# Patient Record
Sex: Female | Born: 1990 | Race: White | Hispanic: No | Marital: Married | Smoking: Former smoker
Health system: Southern US, Community
[De-identification: ages and names within clinical notes are randomized; demographics above are authoritative.]

## PROBLEM LIST (undated history)

## (undated) DIAGNOSIS — K219 Gastro-esophageal reflux disease without esophagitis: Secondary | ICD-10-CM

## (undated) DIAGNOSIS — Z8742 Personal history of other diseases of the female genital tract: Secondary | ICD-10-CM

## (undated) DIAGNOSIS — I10 Essential (primary) hypertension: Secondary | ICD-10-CM

## (undated) DIAGNOSIS — F329 Major depressive disorder, single episode, unspecified: Secondary | ICD-10-CM

## (undated) DIAGNOSIS — F32A Depression, unspecified: Secondary | ICD-10-CM

## (undated) DIAGNOSIS — G43909 Migraine, unspecified, not intractable, without status migrainosus: Secondary | ICD-10-CM

## (undated) DIAGNOSIS — F419 Anxiety disorder, unspecified: Secondary | ICD-10-CM

## (undated) HISTORY — DX: Migraine, unspecified, not intractable, without status migrainosus: G43.909

## (undated) HISTORY — PX: CHOLECYSTECTOMY: SHX55

## (undated) HISTORY — DX: Personal history of other diseases of the female genital tract: Z87.42

---
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
## 2010-09-24 ENCOUNTER — Emergency Department: Payer: Self-pay | Admitting: Emergency Medicine

## 2011-08-25 ENCOUNTER — Ambulatory Visit: Payer: Self-pay | Admitting: Internal Medicine

## 2015-08-10 ENCOUNTER — Ambulatory Visit
Admission: EM | Admit: 2015-08-10 | Discharge: 2015-08-10 | Disposition: A | Discharge status: Home / Self Care | Payer: Managed Care, Other (non HMO) | Attending: Family Medicine | Admitting: Family Medicine

## 2015-08-10 ENCOUNTER — Encounter: Payer: Self-pay | Admitting: Gynecology

## 2015-08-10 DIAGNOSIS — J101 Influenza due to other identified influenza virus with other respiratory manifestations: Secondary | ICD-10-CM | POA: Diagnosis not present

## 2015-08-10 HISTORY — DX: Major depressive disorder, single episode, unspecified: F32.9

## 2015-08-10 HISTORY — DX: Depression, unspecified: F32.A

## 2015-08-10 LAB — RAPID STREP SCREEN (MED CTR MEBANE ONLY): Streptococcus, Group A Screen (Direct): NEGATIVE

## 2015-08-10 LAB — RAPID INFLUENZA A&B ANTIGENS
Influenza A (ARMC): DETECTED
Influenza B (ARMC): NOT DETECTED

## 2015-08-10 MED ORDER — OSELTAMIVIR PHOSPHATE 75 MG PO CAPS: 75.0000 mg | ORAL_CAPSULE | Freq: Two times a day (BID) | ORAL | Status: DC

## 2015-08-10 NOTE — ED Provider Notes (Signed)
CSN: [id number]     Arrival date & time 08/10/15  1039 History   First MD Initiated Contact with Patient 08/10/15 1101     Chief Complaint  Patient presents with  . Sore Throat  . Chills   (Consider location/radiation/quality/duration/timing/severity/associated sxs/prior Treatment) HPI: Patient presents today with symptoms of sore throat, chills, myalgias, fever. Patient states that she has minimal cough and nasal congestion. She denies any nausea vomiting or diarrhea. She denies any abdominal pain chest pain or shortness of breath. She has not had the flu vaccination. She works at [employer].  Past Medical History  Diagnosis Date  . Depression    Past Surgical History  Procedure Laterality Date  . Cholecystectomy     No family history on file. Social History  Substance Use Topics  . Smoking status: Current Some Day Smoker -- 1.00 packs/day  . Smokeless tobacco: None  . Alcohol Use: Yes   OB History    No data available     Review of Systems: Negative except mentioned above.   Allergies  Review of patient's allergies indicates no known allergies.  Home Medications   Prior to Admission medications   Medication Sig Start Date End Date Taking? Authorizing Provider  sertraline (ZOLOFT) 25 MG tablet Take 25 mg by mouth daily.   Yes Historical Provider, MD  oseltamivir (TAMIFLU) 75 MG capsule Take 1 capsule (75 mg total) by mouth every 12 (twelve) hours. 08/10/15   Jolene Provost, MD   Meds Ordered and Administered this Visit  Medications - No data to display  BP 130/92 mmHg  Pulse 104  Temp(Src) 100.7 F (38.2 C) (Oral)  Resp 18  Ht  (1.6 m)  Wt 170 lb (77.111 kg)  BMI 30.12 kg/m2  SpO2 100%  LMP 07/20/2015 No data found.   Physical Exam   GENERAL: NAD HEENT: mild ro moderate pharyngeal erythema, no exudate, no erythema of TMs, no cervical LAD RESP: CTA B CARD: RRR NEURO: CN II-XII grossly intact   ED Course  Procedures (including critical care  time)  Labs Review Labs Reviewed  RAPID INFLUENZA A&B ANTIGENS ([facility] ONLY)  RAPID STREP SCREEN (NOT AT [facility])  CULTURE, GROUP A STREP [facility])    MDM   1. Influenza A    flu screen positive for influenza A. Rapid strep test negative. Patient given prescription for Tamiflu. Encouraged Tylenol, Motrin when necessary. Rest, hydration encouraged if symptoms persist or worsen I do recommend the patient seek medical attention. Work excuse given for 2 days. Follow-up here or with PMD if further time needed.    Jolene Provost, MD 08/10/15 1158

## 2015-08-10 NOTE — ED Notes (Signed)
Patient c/o sore throat / chills / body ache / temp last pm of 103.

## 2015-08-12 LAB — CULTURE, GROUP A STREP (THRC)

## 2016-01-02 ENCOUNTER — Ambulatory Visit (INDEPENDENT_AMBULATORY_CARE_PROVIDER_SITE_OTHER): Payer: Worker's Compensation

## 2016-01-02 ENCOUNTER — Ambulatory Visit
Admission: EM | Admit: 2016-01-02 | Discharge: 2016-01-02 | Disposition: A | Discharge status: Home / Self Care | Payer: Worker's Compensation | Attending: Family Medicine | Admitting: Family Medicine

## 2016-01-02 ENCOUNTER — Encounter: Payer: Self-pay | Admitting: Emergency Medicine

## 2016-01-02 DIAGNOSIS — S39012A Strain of muscle, fascia and tendon of lower back, initial encounter: Secondary | ICD-10-CM

## 2016-01-02 LAB — PREGNANCY, URINE: Preg Test, Ur: NEGATIVE

## 2016-01-02 MED ORDER — KETOROLAC TROMETHAMINE 60 MG/2ML IM SOLN
60.0000 mg | Freq: Once | INTRAMUSCULAR | Status: AC
  Administered 2016-01-02: 60 mg via INTRAMUSCULAR

## 2016-01-02 MED ORDER — MELOXICAM 15 MG PO TABS: 15.0000 mg | ORAL_TABLET | Freq: Every day | ORAL | Status: DC

## 2016-01-02 MED ORDER — TIZANIDINE HCL 4 MG PO TABS: 4.0000 mg | ORAL_TABLET | Freq: Four times a day (QID) | ORAL | Status: DC | PRN

## 2016-01-02 NOTE — ED Provider Notes (Signed)
CSN: [id number]     Arrival date & time 01/02/16  1126 History   First MD Initiated Contact with Patient 01/02/16 1320     Chief Complaint  Patient presents with  . Back Pain  . Worker's Comp    (Consider location/radiation/quality/duration/timing/severity/associated sxs/prior Treatment) HPI  25 year old female who presents with a work comp back injury. She states that she works at [employer] and was helping and older man lift to 6070 pound box when he lost his grip in effort to keep the box from falling she bent forward and twisted onto a pallet. At that time she felt a pop in her lower back. She did not have pain initially but later on that evening began to develop low back pain that radiated over her anterior thighs to the level of her knees. She has no numbness or tingling. She states that the pain into her legs is constant and bilateral.. She's had no previous back injuries.    Past Medical History  Diagnosis Date  . Depression    Past Surgical History  Procedure Laterality Date  . Cholecystectomy     History reviewed. No pertinent family history. Social History  Substance Use Topics  . Smoking status: Current Some Day Smoker -- 1.00 packs/day    Types: Cigarettes  . Smokeless tobacco: None  . Alcohol Use: Yes   OB History    No data available     Review of Systems  Constitutional: Positive for activity change. Negative for fever, chills and fatigue.  Musculoskeletal: Positive for back pain.  All other systems reviewed and are negative.   Allergies  Review of patient's allergies indicates no known allergies.  Home Medications   Prior to Admission medications   Medication Sig Start Date End Date Taking? Authorizing Provider  meloxicam (MOBIC) 15 MG tablet Take 1 tablet (15 mg total) by mouth daily. 01/02/16   Lutricia FeilWilliam P Aneth Schlagel, PA-C  oseltamivir (TAMIFLU) 75 MG capsule Take 1 capsule (75 mg total) by mouth every 12 (twelve) hours. 08/10/15   Jolene ProvostKirtida Patel, MD   sertraline (ZOLOFT) 25 MG tablet Take 25 mg by mouth daily.    Historical Provider, MD  tiZANidine (ZANAFLEX) 4 MG tablet Take 1 tablet (4 mg total) by mouth every 6 (six) hours as needed for muscle spasms. 01/02/16   Lutricia FeilWilliam P Ysela Hettinger, PA-C   Meds Ordered and Administered this Visit   Medications  ketorolac (TORADOL) injection 60 mg (60 mg Intramuscular Given 01/02/16 1341)    BP 132/87 mmHg  Pulse 85  Temp(Src) 98 F (36.7 C) (Oral)  Resp 16  Ht 5\' 3"  (1.6 m)  Wt 175 lb (79.379 kg)  BMI 31.01 kg/m2  SpO2 100%  LMP 12/26/2015 (Approximate) No data found.   Physical Exam  Constitutional: She is oriented to person, place, and time. She appears well-developed and well-nourished. No distress.  HENT:  Head: Normocephalic and atraumatic.  Eyes: Conjunctivae are normal. Pupils are equal, round, and reactive to light.  Neck: Normal range of motion. Neck supple.  Musculoskeletal: She exhibits tenderness.  Omission of the lumbar spine was carried out with Efraim KaufmannMelissa, C MA chaperone. Eustachian has a level pelvis in stance. She has very limited range of motion to forward flexion and lateral flexion bilaterally complaining of pain with all motion. She is able to toe and heel walk adequately. DTRs are 2+ over 4 at the knee and ankle. There is no clonus present. Sensation is intact to light touch throughout the lower extremities. EHL  peroneal and anterior tibialis muscles are strong to clinical testing. Straight leg raise testing is negative at 90 in the sitting position bilaterally.  Neurological: She is alert and oriented to person, place, and time. She has normal reflexes. She displays normal reflexes. She exhibits normal muscle tone. Coordination normal.  Skin: Skin is warm and dry. She is not diaphoretic.  Psychiatric: She has a normal mood and affect. Her behavior is normal. Judgment and thought content normal.  Nursing note and vitals reviewed.   ED Course  Procedures (including critical  care time)  Labs Review Labs Reviewed  PREGNANCY, URINE    Imaging Review Dg Lumbar Spine Complete  01/02/2016  CLINICAL DATA:  Low back pain following heavy lifting, initial encounter EXAM: LUMBAR SPINE - COMPLETE 4+ VIEW COMPARISON:  None. FINDINGS: There is no evidence of lumbar spine fracture. Alignment is normal. Intervertebral disc spaces are maintained. IMPRESSION: No acute abnormality noted. Electronically Signed   By: Alcide CleverMark  Lukens M.D.   On: 01/02/2016 14:28     Visual Acuity Review  Right Eye Distance:   Left Eye Distance:   Bilateral Distance:    Right Eye Near:   Left Eye Near:    Bilateral Near:      Medications  ketorolac (TORADOL) injection 60 mg (60 mg Intramuscular Given 01/02/16 1341)     MDM   1. Lumbar strain, initial encounter    Discharge Medication List as of 01/02/2016  2:44 PM    START taking these medications   Details  meloxicam (MOBIC) 15 MG tablet Take 1 tablet (15 mg total) by mouth daily., Starting 01/02/2016, Until Discontinued, Normal    tiZANidine (ZANAFLEX) 4 MG tablet Take 1 tablet (4 mg total) by mouth every 6 (six) hours as needed for muscle spasms., Starting 01/02/2016, Until Discontinued, Normal      Plan: 1. Test/x-ray results and diagnosis reviewed with patient 2. rx as per orders; risks, benefits, potential side effects reviewed with patient 3. Recommend supportive treatment with Symptom avoidance and rest. Use heat or ice as necessary. She needs to follow [facility] with Glenard Haringommy Moore ,FNP [facility] 4. F/u prn if symptoms worsen or don't improve     Lutricia FeilWilliam P Fontaine Hehl, PA-C 01/02/16 1455

## 2016-01-02 NOTE — Discharge Instructions (Signed)
Back Injury Prevention °Back injuries can be very painful. They can also be difficult to heal. After having one back injury, you are more likely to injure your back again. It is important to learn how to avoid injuring or re-injuring your back. The following tips can help you to prevent a back injury. °WHAT SHOULD I KNOW ABOUT PHYSICAL FITNESS? °· Exercise for 30 minutes per day on most days of the week or as directed by your health care provider. Make sure to: °· Do aerobic exercises, such as walking, jogging, biking, or swimming. °· Do exercises that increase balance and strength, such as tai chi and yoga. These can decrease your risk of falling and injuring your back. °· Do stretching exercises to help with flexibility. °· Try to develop strong abdominal muscles. Your abdominal muscles provide a lot of the support that is needed by your back. °· Maintain a healthy weight.  This helps to decrease your risk of a back injury. °WHAT SHOULD I KNOW ABOUT MY DIET? °· Talk with your health care provider about your overall diet. Take supplements and vitamins only as directed by your health care provider. °· Talk with your health care provider about how much calcium and vitamin D you need each day. These nutrients help to prevent weakening of the bones (osteoporosis). Osteoporosis can cause broken (fractured) bones, which lead to back pain. °· Include good sources of calcium in your diet, such as dairy products, green leafy vegetables, and products that have had calcium added to them (fortified). °· Include good sources of vitamin D in your diet, such as milk and foods that are fortified with vitamin D. °WHAT SHOULD I KNOW ABOUT MY POSTURE? °· Sit up straight and stand up straight. Avoid leaning forward when you sit or hunching over when you stand. °· Choose chairs that have good low-back (lumbar) support. °· If you work at a desk, sit close to it so you do not need to lean over. Keep your chin tucked in. Keep your neck  drawn back, and keep your elbows bent at a right angle. Your arms should look like the letter "L." °· Sit high and close to the steering wheel when you drive. Add a lumbar support to your car seat, if needed. °· Avoid sitting or standing in one position for very long. Take breaks to get up, stretch, and walk around at least one time every hour. Take breaks every hour if you are driving for long periods of time. °· Sleep on your side with your knees slightly bent, or sleep on your back with a pillow under your knees. Do not lie on the front of your body to sleep. °WHAT SHOULD I KNOW ABOUT LIFTING, TWISTING, AND REACHING? °Lifting and Heavy Lifting °· Avoid heavy lifting, especially repetitive heavy lifting. If you must do heavy lifting: °· Stretch before lifting. °· Work slowly. °· Rest between lifts. °· Use a tool such as a cart or a dolly to move objects if one is available. °· Make several small trips instead of carrying one heavy load. °· Ask for help when you need it, especially when moving big objects. °· Follow these steps when lifting: °· Stand with your feet shoulder-width apart. °· Get as close to the object as you can. Do not try to pick up a heavy object that is far from your body. °· Use handles or lifting straps if they are available. °· Bend at your knees. Squat down, but keep your heels off the floor. °·   Keep your shoulders pulled back, your chin tucked in, and your back straight. °· Lift the object slowly while you tighten the muscles in your legs, abdomen, and buttocks. Keep the object as close to the center of your body as possible. °· Follow these steps when putting down a heavy load: °· Stand with your feet shoulder-width apart. °· Lower the object slowly while you tighten the muscles in your legs, abdomen, and buttocks. Keep the object as close to the center of your body as possible. °· Keep your shoulders pulled back, your chin tucked in, and your back straight. °· Bend at your knees. Squat  down, but keep your heels off the floor. °· Use handles or lifting straps if they are available. °Twisting and Reaching °· Avoid lifting heavy objects above your waist. °· Do not twist at your waist while you are lifting or carrying a load. If you need to turn, move your feet. °· Do not bend over without bending at your knees. °· Avoid reaching over your head, across a table, or for an object on a high surface. °WHAT ARE SOME OTHER TIPS? °· Avoid wet floors and icy ground. Keep sidewalks clear of ice to prevent falls. °· Do not sleep on a mattress that is too soft or too hard. °· Keep items that are used frequently within easy reach. °· Put heavier objects on shelves at waist level, and put lighter objects on lower or higher shelves. °· Find ways to decrease your stress, such as exercise, massage, or relaxation techniques. Stress can build up in your muscles. Tense muscles are more vulnerable to injury. °· Talk with your health care provider if you feel anxious or depressed. These conditions can make back pain worse. °· Wear flat heel shoes with cushioned soles. °· Avoid sudden movements. °· Use both shoulder straps when carrying a backpack. °· Do not use any tobacco products, including cigarettes, chewing tobacco, or electronic cigarettes. If you need help quitting, ask your health care provider. °  °This information is not intended to replace advice given to you by your health care provider. Make sure you discuss any questions you have with your health care provider. °  °Document Released: 07/30/2004 Document Revised: 11/06/2014 Document Reviewed: 06/26/2014 °Elsevier Interactive Patient Education ©2016 [employer] ° °Lumbosacral Strain °Lumbosacral strain is a strain of any of the parts that make up your lumbosacral vertebrae. Your lumbosacral vertebrae are the bones that make up the lower third of your backbone. Your lumbosacral vertebrae are held together by muscles and tough, fibrous tissue (ligaments).    °CAUSES  °A sudden blow to your back can cause lumbosacral strain. Also, anything that causes an excessive stretch of the muscles in the low back can cause this strain. This is typically seen when people exert themselves strenuously, fall, lift heavy objects, bend, or crouch repeatedly. °RISK FACTORS °· Physically demanding work. °· Participation in pushing or pulling sports or sports that require a sudden twist of the back (tennis, golf, baseball). °· Weight lifting. °· Excessive lower back curvature. °· Forward-tilted pelvis. °· Weak back or abdominal muscles or both. °· Tight hamstrings. °SIGNS AND SYMPTOMS  °Lumbosacral strain may cause pain in the area of your injury or pain that moves (radiates) down your leg.  °DIAGNOSIS °Your health care provider can often diagnose lumbosacral strain through a physical exam. In some cases, you may need tests such as X-ray exams.  °TREATMENT  °Treatment for your lower back injury depends on many factors that   your clinician will have to evaluate. However, most treatment will include the use of anti-inflammatory medicines. °HOME CARE INSTRUCTIONS  °· Avoid hard physical activities (tennis, racquetball, waterskiing) if you are not in proper physical condition for it. This may aggravate or create problems. °· If you have a back problem, avoid sports requiring sudden body movements. Swimming and walking are generally safer activities. °· Maintain good posture. °· Maintain a healthy weight. °· For acute conditions, you may put ice on the injured area. °¨ Put ice in a plastic bag. °¨ Place a towel between your skin and the bag. °¨ Leave the ice on for 20 minutes, 2-3 times a day. °· When the low back starts healing, stretching and strengthening exercises may be recommended. °SEEK MEDICAL CARE IF: °· Your back pain is getting worse. °· You experience severe back pain not relieved with medicines. °SEEK IMMEDIATE MEDICAL CARE IF:  °· You have numbness, tingling, weakness, or problems  with the use of your arms or legs. °· There is a change in bowel or bladder control. °· You have increasing pain in any area of the body, including your belly (abdomen). °· You notice shortness of breath, dizziness, or feel faint. °· You feel sick to your stomach (nauseous), are throwing up (vomiting), or become sweaty. °· You notice discoloration of your toes or legs, or your feet get very cold. °MAKE SURE YOU:  °· Understand these instructions. °· Will watch your condition. °· Will get help right away if you are not doing well or get worse. °  °This information is not intended to replace advice given to you by your health care provider. Make sure you discuss any questions you have with your health care provider. °  °Document Released: 04/01/2005 Document Revised: 07/13/2014 Document Reviewed: 02/08/2013 °Elsevier Interactive Patient Education ©2016 [employer] ° °

## 2016-01-02 NOTE — ED Notes (Signed)
Patient states that she was lifting a box weighing about 60lbs at work yesterday and felt a pop in her lower back.  Patient c/o back pain.

## 2016-07-24 ENCOUNTER — Ambulatory Visit (INDEPENDENT_AMBULATORY_CARE_PROVIDER_SITE_OTHER): Payer: Self-pay | Admitting: Obstetrics and Gynecology

## 2016-07-24 VITALS — BP 113/74 | HR 77 | Ht 63.0 in | Wt 206.0 lb

## 2016-07-24 DIAGNOSIS — Z1389 Encounter for screening for other disorder: Secondary | ICD-10-CM

## 2016-07-24 DIAGNOSIS — T7589XA Other specified effects of external causes, initial encounter: Secondary | ICD-10-CM

## 2016-07-24 DIAGNOSIS — E669 Obesity, unspecified: Secondary | ICD-10-CM

## 2016-07-24 DIAGNOSIS — N912 Amenorrhea, unspecified: Secondary | ICD-10-CM

## 2016-07-24 DIAGNOSIS — Z3481 Encounter for supervision of other normal pregnancy, first trimester: Secondary | ICD-10-CM

## 2016-07-24 DIAGNOSIS — Z113 Encounter for screening for infections with a predominantly sexual mode of transmission: Secondary | ICD-10-CM

## 2016-07-24 DIAGNOSIS — Z3687 Encounter for antenatal screening for uncertain dates: Secondary | ICD-10-CM

## 2016-07-24 NOTE — Progress Notes (Addendum)
Alicia Hendricks presents for NOB nurse interview visit. Pregnancy confirmation done by [facility] on 07/08/16 by UPT-positive.  G-2.  P-1001. Pregnancy education material explained and given. Works for a [employer] And has changed litter box before she knew she was pregnant, Toxoplasmosis lab ordered. NOB labs ordered. TSH/HbgA1c due to Increased BMI.  HIV labs and Drug screen were explained optional and she did not decline. Drug screen ordered. PNV encouraged. Genetic screening, pt is undecided and may discuss with provider. Pt. To follow up with provider in 3 weeks for NOB physical.  Ultrasound ordered-unsure of LMP. All questions answered.

## 2016-07-24 NOTE — Patient Instructions (Signed)
Pregnancy and Zika Virus Disease Introduction Zika virus disease, or Zika, is an illness that can spread to people from mosquitoes that carry the virus. It may also spread from person to person through infected body fluids. Zika first occurred in [place], but recently it has spread to new areas. The virus occurs in tropical climates. The location of Zika continues to change. Most people who become infected with Zika virus do not develop serious illness. However, Zika may cause birth defects in an unborn baby whose mother is infected with the virus. It may also increase the risk of miscarriage. What are the symptoms of Zika virus disease? In many cases, people who have been infected with Zika virus do not develop any symptoms. If symptoms appear, they usually start about a week after the person is infected. Symptoms are usually mild. They may include:  Fever.  Rash.  Red eyes.  Joint pain. How does Zika virus disease spread? The main way that Zika virus spreads is through the bite of a certain type of mosquito. Unlike most types of mosquitos, which bite only at night, the type of mosquito that carries Zika virus bites both at night and during the day. Zika virus can also spread through sexual contact, through a blood transfusion, and from a mother to her baby before or during birth. Once you have had Zika virus disease, it is unlikely that you will get it again. Can I pass Zika to my baby during pregnancy? Yes, Zika can pass from a mother to her baby before or during birth. What problems can Zika cause for my baby? A woman who is infected with Zika virus while pregnant is at risk of having her baby born with a condition in which the brain or head is smaller than expected (microcephaly). Babies who have microcephaly can have developmental delays, seizures, hearing problems, and vision problems. Having Zika virus disease during pregnancy can also increase the risk of miscarriage. How can Zika  virus disease be prevented? There is no vaccine to prevent Zika. The best way to prevent the disease is to avoid infected mosquitoes and avoid exposure to body fluids that can spread the virus. Avoid any possible exposure to Zika by taking the following precautions. For women and their sex partners:  Avoid traveling to high-risk areas. The locations where Zika is being reported change often. To identify high-risk areas, check the CDC travel website: www.cdc.gov/zika/geo/index.html  If you or your sex partner must travel to a high-risk area, talk with a health care provider before and after traveling.  Take all precautions to avoid mosquito bites if you live in, or travel to, any of the high-risk areas. Insect repellents are safe to use during pregnancy.  Ask your health care provider when it is safe to have sexual contact. For women:  If you are pregnant or trying to become pregnant, avoid sexual contact with persons who may have been exposed to Zika virus, persons who have possible symptoms of Zika, or persons whose history you are unsure about. If you choose to have sexual contact with someone who may have been exposed to Zika virus, use condoms correctly during the entire duration of sexual activity, every time. Do not share sexual devices, as you may be exposed to body fluids.  Ask your health care provider about when it is safe to attempt pregnancy after a possible exposure to Zika virus. What steps should I take to avoid mosquito bites? Take these steps to avoid mosquito bites when you   are in a high-risk area:  Wear loose clothing that covers your arms and legs.  Limit your outdoor activities.  Do not open windows unless they have window screens.  Sleep under mosquito nets.  Use insect repellent. The best insect repellents have:  DEET, picaridin, oil of lemon eucalyptus (OLE), or IR3535 in them.  Higher amounts of an active ingredient in them.  Remember that insect repellents  are safe to use during pregnancy.  Do not use OLE on children who are younger than 3 years of age. Do not use insect repellent on babies who are younger than 2 months of age.  Cover your child's stroller with mosquito netting. Make sure the netting fits snugly and that any loose netting does not cover your child's mouth or nose. Do not use a blanket as a mosquito-protection cover.  Do not apply insect repellent underneath clothing.  If you are using sunscreen, apply the sunscreen before applying the insect repellent.  Treat clothing with permethrin. Do not apply permethrin directly to your skin. Follow label directions for safe use.  Get rid of standing water, where mosquitoes may reproduce. Standing water is often found in items such as buckets, bowls, animal food dishes, and flowerpots. When you return from traveling to any high-risk area, continue taking actions to protect yourself against mosquito bites for 3 weeks, even if you show no signs of illness. This will prevent spreading Zika virus to uninfected mosquitoes. What should I know about the sexual transmission of Zika? People can spread Zika to their sexual partners during vaginal, anal, or oral sex, or by sharing sexual devices. Many people with Zika do not develop symptoms, so a person could spread the disease without knowing that they are infected. The greatest risk is to women who are pregnant or who may become pregnant. Zika virus can live longer in semen than it can live in blood. Couples can prevent sexual transmission of the virus by:  Using condoms correctly during the entire duration of sexual activity, every time. This includes vaginal, anal, and oral sex.  Not sharing sexual devices. Sharing increases your risk of being exposed to body fluid from another person.  Avoiding all sexual activity until your health care provider says it is safe. Should I be tested for Zika virus? A sample of your blood can be tested for Zika  virus. A pregnant woman should be tested if she may have been exposed to the virus or if she has symptoms of Zika. She may also have additional tests done during her pregnancy, such ultrasound testing. Talk with your health care provider about which tests are recommended. This information is not intended to replace advice given to you by your health care provider. Make sure you discuss any questions you have with your health care provider. Document Released: 03/13/2015 Document Revised: 11/28/2015 Document Reviewed: 03/06/2015  2017 Elsevier Minor Illnesses and Medications in Pregnancy  Cold/Flu:  Sudafed for congestion- Robitussin (plain) for cough- Tylenol for discomfort.  Please follow the directions on the label.  Try not to take any more than needed.  OTC Saline nasal spray and air humidifier or cool-mist  Vaporizer to sooth nasal irritation and to loosen congestion.  It is also important to increase intake of non carbonated fluids, especially if you have a fever.  Constipation:  Colace-2 capsules at bedtime; Metamucil- follow directions on label; Senokot- 1 tablet at bedtime.  Any one of these medications can be used.  It is also very important to increase   fluids and fruits along with regular exercise.  If problem persists please call the office.  Diarrhea:  Kaopectate as directed on the label.  Eat a bland diet and increase fluids.  Avoid highly seasoned foods.  Headache:  Tylenol 1 or 2 tablets every 3-4 hours as needed  Indigestion:  Maalox, Mylanta, Tums or Rolaids- as directed on label.  Also try to eat small meals and avoid fatty, greasy or spicy foods.  Nausea with or without Vomiting:  Nausea in pregnancy is caused by increased levels of hormones in the body which influence the digestive system and cause irritation when stomach acids accumulate.  Symptoms usually subside after 1st trimester of pregnancy.  Try the following: 1. Keep saltines, graham crackers or dry toast by your bed to  eat upon awakening. 2. Don't let your stomach get empty.  Try to eat 5-6 small meals per day instead of 3 large ones. 3. Avoid greasy fatty or highly seasoned foods.  4. Take OTC Unisom 1 tablet at bed time along with OTC Vitamin B6 25-50 mg 3 times per day.    If nausea continues with vomiting and you are unable to keep down food and fluids you may need a prescription medication.  Please notify your provider.   Sore throat:  Chloraseptic spray, throat lozenges and or plain Tylenol.  Vaginal Yeast Infection:  OTC Monistat for 7 days as directed on label.  If symptoms do not resolve within a week notify provider.  If any of the above problems do not subside with recommended treatment please call the office for further assistance.   Do not take Aspirin, Advil, Motrin or Ibuprofen.  * * OTC= Over the counter Hyperemesis Gravidarum Hyperemesis gravidarum is a severe form of nausea and vomiting that happens during pregnancy. Hyperemesis is worse than morning sickness. It may cause you to have nausea or vomiting all day for many days. It may keep you from eating and drinking enough food and liquids. Hyperemesis usually occurs during the first half (the first 20 weeks) of pregnancy. It often goes away once a woman is in her second half of pregnancy. However, sometimes hyperemesis continues through an entire pregnancy. What are the causes? The cause of this condition is not known. It may be related to changes in chemicals (hormones) in the body during pregnancy, such as the high level of pregnancy hormone (human chorionic gonadotropin) or the increase in the female sex hormone (estrogen). What are the signs or symptoms? Symptoms of this condition include:  Severe nausea and vomiting.  Nausea that does not go away.  Vomiting that does not allow you to keep any food down.  Weight loss.  Body fluid loss (dehydration).  Having no desire to eat, or not liking food that you have previously  enjoyed. How is this diagnosed? This condition may be diagnosed based on:  A physical exam.  Your medical history.  Your symptoms.  Blood tests.  Urine tests. How is this treated? This condition may be managed with medicine. If medicines to do not help relieve nausea and vomiting, you may need to receive fluids through an IV tube at the hospital. Follow these instructions at home:  Take over-the-counter and prescription medicines only as told by your health care provider.  Avoid iron pills and multivitamins that contain iron for the first 3-4 months of pregnancy. If you take prescription iron pills, do not stop taking them unless your health care provider approves.  Take the following actions to help   prevent nausea and vomiting:  In the morning, before getting out of bed, try eating a couple of dry crackers or a piece of toast.  Avoid foods and smells that upset your stomach. Fatty and spicy foods may make nausea worse.  Eat 5-6 small meals a day.  Do not drink fluids while eating meals. Drink between meals.  Eat or suck on things that have ginger in them. Ginger can help relieve nausea.  Avoid food preparation. The smell of food can spoil your appetite or trigger nausea.  Follow instructions from your health care provider about eating or drinking restrictions.  For snacks, eat high-protein foods, such as cheese.  Keep all follow-up and pre-birth (prenatal) visits as told by your health care provider. This is important. Contact a health care provider if:  You have pain in your abdomen.  You have a severe headache.  You have vision problems.  You are losing weight. Get help right away if:  You cannot drink fluids without vomiting.  You vomit blood.  You have constant nausea and vomiting.  You are very weak.  You are very thirsty.  You feel dizzy.  You faint.  You have a fever or other symptoms that last for more than 2-3 days.  You have a fever and  your symptoms suddenly get worse. Summary  Hyperemesis gravidarum is a severe form of nausea and vomiting that happens during pregnancy.  Making some changes to your eating habits may help relieve nausea and vomiting.  This condition may be managed with medicine.  If medicines to do not help relieve nausea and vomiting, you may need to receive fluids through an IV tube at the hospital. This information is not intended to replace advice given to you by your health care provider. Make sure you discuss any questions you have with your health care provider. Document Released: 06/22/2005 Document Revised: 02/19/2016 Document Reviewed: 02/19/2016 Elsevier Interactive Patient Education  2017 [employer] First Trimester of Pregnancy The first trimester of pregnancy is from week 1 until the end of week 12 (months 1 through 3). During this time, your baby will begin to develop inside you. At 6-8 weeks, the eyes and face are formed, and the heartbeat can be seen on ultrasound. At the end of 12 weeks, all the baby's organs are formed. Prenatal care is all the medical care you receive before the birth of your baby. Make sure you get good prenatal care and follow all of your doctor's instructions. Follow these instructions at home: Medicines  Take medicine only as told by your doctor. Some medicines are safe and some are not during pregnancy.  Take your prenatal vitamins as told by your doctor.  Take medicine that helps you poop (stool softener) as needed if your doctor says it is okay. Diet  Eat regular, healthy meals.  Your doctor will tell you the amount of weight gain that is right for you.  Avoid raw meat and uncooked cheese.  If you feel sick to your stomach (nauseous) or throw up (vomit):  Eat 4 or 5 small meals a day instead of 3 large meals.  Try eating a few soda crackers.  Drink liquids between meals instead of during meals.  If you have a hard time pooping  (constipation):  Eat high-fiber foods like fresh vegetables, fruit, and whole grains.  Drink enough fluids to keep your pee (urine) clear or pale yellow. Activity and Exercise  Exercise only as told by your doctor. Stop exercising if   you have cramps or pain in your lower belly (abdomen) or low back.  Try to avoid standing for long periods of time. Move your legs often if you must stand in one place for a long time.  Avoid heavy lifting.  Wear low-heeled shoes. Sit and stand up straight.  You can have sex unless your doctor tells you not to. Relief of Pain or Discomfort  Wear a good support bra if your breasts are sore.  Take warm water baths (sitz baths) to soothe pain or discomfort caused by hemorrhoids. Use hemorrhoid cream if your doctor says it is okay.  Rest with your legs raised if you have leg cramps or low back pain.  Wear support hose if you have puffy, bulging veins (varicose veins) in your legs. Raise (elevate) your feet for 15 minutes, 3-4 times a day. Limit salt in your diet. Prenatal Care  Schedule your prenatal visits by the twelfth week of pregnancy.  Write down your questions. Take them to your prenatal visits.  Keep all your prenatal visits as told by your doctor. Safety  Wear your seat belt at all times when driving.  Make a list of emergency phone numbers. The list should include numbers for family, friends, the hospital, and police and fire departments. General Tips  Ask your doctor for a referral to a local prenatal class. Begin classes no later than at the start of month 6 of your pregnancy.  Ask for help if you need counseling or help with nutrition. Your doctor can give you advice or tell you where to go for help.  Do not use hot tubs, steam rooms, or saunas.  Do not douche or use tampons or scented sanitary pads.  Do not cross your legs for long periods of time.  Avoid litter boxes and soil used by cats.  Avoid all smoking, herbs, and  alcohol. Avoid drugs not approved by your doctor.  Do not use any tobacco products, including cigarettes, chewing tobacco, and electronic cigarettes. If you need help quitting, ask your doctor. You may get counseling or other support to help you quit.  Visit your dentist. At home, brush your teeth with a soft toothbrush. Be gentle when you floss. Get help if:  You are dizzy.  You have mild cramps or pressure in your lower belly.  You have a nagging pain in your belly area.  You continue to feel sick to your stomach, throw up, or have watery poop (diarrhea).  You have a bad smelling fluid coming from your vagina.  You have pain with peeing (urination).  You have increased puffiness (swelling) in your face, hands, legs, or ankles. Get help right away if:  You have a fever.  You are leaking fluid from your vagina.  You have spotting or bleeding from your vagina.  You have very bad belly cramping or pain.  You gain or lose weight rapidly.  You throw up blood. It may look like coffee grounds.  You are around people who have German measles, fifth disease, or chickenpox.  You have a very bad headache.  You have shortness of breath.  You have any kind of trauma, such as from a fall or a car accident. This information is not intended to replace advice given to you by your health care provider. Make sure you discuss any questions you have with your health care provider. Document Released: 12/09/2007 Document Revised: 11/28/2015 Document Reviewed: 05/02/2013 Elsevier Interactive Patient Education  2017 [employer] Commonly Asked Questions   During Pregnancy  Cats: A parasite can be excreted in cat feces.  To avoid exposure you need to have another person empty the little box.  If you must empty the litter box you will need to wear gloves.  Wash your hands after handling your cat.  This parasite can also be found in raw or undercooked meat so this should also be avoided.  Colds,  Sore Throats, Flu: Please check your medication sheet to see what you can take for symptoms.  If your symptoms are unrelieved by these medications please call the office.  Dental Work: Most any dental work your dentist recommends is permitted.  X-rays should only be taken during the first trimester if absolutely necessary.  Your abdomen should be shielded with a lead apron during all x-rays.  Please notify your provider prior to receiving any x-rays.  Novocaine is fine; gas is not recommended.  If your dentist requires a note from us prior to dental work please call the office and we will provide one for you.  Exercise: Exercise is an important part of staying healthy during your pregnancy.  You may continue most exercises you were accustomed to prior to pregnancy.  Later in your pregnancy you will most likely notice you have difficulty with activities requiring balance like riding a bicycle.  It is important that you listen to your body and avoid activities that put you at a higher risk of falling.  Adequate rest and staying well hydrated are a must!  If you have questions about the safety of specific activities ask your provider.    Exposure to Children with illness: Try to avoid obvious exposure; report any symptoms to us when noted,  If you have chicken pos, red measles or mumps, you should be immune to these diseases.   Please do not take any vaccines while pregnant unless you have checked with your OB provider.  Fetal Movement: After 28 weeks we recommend you do "kick counts" twice daily.  Lie or sit down in a calm quiet environment and count your baby movements "kicks".  You should feel your baby at least 10 times per hour.  If you have not felt 10 kicks within the first hour get up, walk around and have something sweet to eat or drink then repeat for an additional hour.  If count remains less than 10 per hour notify your provider.  Fumigating: Follow your pest control agent's advice as to how long  to stay out of your home.  Ventilate the area well before re-entering.  Hemorrhoids:   Most over-the-counter preparations can be used during pregnancy.  Check your medication to see what is safe to use.  It is important to use a stool softener or fiber in your diet and to drink lots of liquids.  If hemorrhoids seem to be getting worse please call the office.   Hot Tubs:  Hot tubs Jacuzzis and saunas are not recommended while pregnant.  These increase your internal body temperature and should be avoided.  Intercourse:  Sexual intercourse is safe during pregnancy as long as you are comfortable, unless otherwise advised by your provider.  Spotting may occur after intercourse; report any bright red bleeding that is heavier than spotting.  Labor:  If you know that you are in labor, please go to the hospital.  If you are unsure, please call the office and let us help you decide what to do.  Lifting, straining, etc:  If your job requires heavy lifting or   straining please check with your provider for any limitations.  Generally, you should not lift items heavier than that you can lift simply with your hands and arms (no back muscles)  Painting:  Paint fumes do not harm your pregnancy, but may make you ill and should be avoided if possible.  Latex or water based paints have less odor than oils.  Use adequate ventilation while painting.  Permanents & Hair Color:  Chemicals in hair dyes are not recommended as they cause increase hair dryness which can increase hair loss during pregnancy.  " Highlighting" and permanents are allowed.  Dye may be absorbed differently and permanents may not hold as well during pregnancy.  Sunbathing:  Use a sunscreen, as skin burns easily during pregnancy.  Drink plenty of fluids; avoid over heating.  Tanning Beds:  Because their possible side effects are still unknown, tanning beds are not recommended.  Ultrasound Scans:  Routine ultrasounds are performed at approximately 20  weeks.  You will be able to see your baby's general anatomy an if you would like to know the gender this can usually be determined as well.  If it is questionable when you conceived you may also receive an ultrasound early in your pregnancy for dating purposes.  Otherwise ultrasound exams are not routinely performed unless there is a medical necessity.  Although you can request a scan we ask that you pay for it when conducted because insurance does not cover " patient request" scans.  Work: If your pregnancy proceeds without complications you may work until your due date, unless your physician or employer advises otherwise.  Round Ligament Pain/Pelvic Discomfort:  Sharp, shooting pains not associated with bleeding are fairly common, usually occurring in the second trimester of pregnancy.  They tend to be worse when standing up or when you remain standing for long periods of time.  These are the result of pressure of certain pelvic ligaments called "round ligaments".  Rest, Tylenol and heat seem to be the most effective relief.  As the womb and fetus grow, they rise out of the pelvis and the discomfort improves.  Please notify the office if your pain seems different than that described.  It may represent a more serious condition.   

## 2016-07-25 LAB — RH TYPE: Rh Factor: POSITIVE

## 2016-07-25 LAB — CBC WITH DIFFERENTIAL/PLATELET
BASOS ABS: 0 10*3/uL (ref 0.0–0.2)
Basos: 0 %
EOS (ABSOLUTE): 0.2 10*3/uL (ref 0.0–0.4)
EOS: 2 %
HEMATOCRIT: 40.2 % (ref 34.0–46.6)
Hemoglobin: 13.8 g/dL (ref 11.1–15.9)
Immature Grans (Abs): 0 10*3/uL (ref 0.0–0.1)
Immature Granulocytes: 0 %
LYMPHS ABS: 2.5 10*3/uL (ref 0.7–3.1)
Lymphs: 19 %
MCH: 29.1 pg (ref 26.6–33.0)
MCHC: 34.3 g/dL (ref 31.5–35.7)
MCV: 85 fL (ref 79–97)
MONOS ABS: 0.9 10*3/uL (ref 0.1–0.9)
Monocytes: 7 %
Neutrophils Absolute: 9.2 10*3/uL — ABNORMAL HIGH (ref 1.4–7.0)
Neutrophils: 72 %
PLATELETS: 361 10*3/uL (ref 150–379)
RBC: 4.75 x10E6/uL (ref 3.77–5.28)
RDW: 13.1 % (ref 12.3–15.4)
WBC: 12.8 10*3/uL — AB (ref 3.4–10.8)

## 2016-07-25 LAB — HIV ANTIBODY (ROUTINE TESTING W REFLEX): HIV Screen 4th Generation wRfx: NONREACTIVE

## 2016-07-25 LAB — HEMOGLOBIN A1C
Est. average glucose Bld gHb Est-mCnc: 97 mg/dL
HEMOGLOBIN A1C: 5 % (ref 4.8–5.6)

## 2016-07-25 LAB — ANTIBODY SCREEN: Antibody Screen: NEGATIVE

## 2016-07-25 LAB — TOXOPLASMA ANTIBODIES- IGG AND  IGM

## 2016-07-25 LAB — RPR: RPR Ser Ql: NONREACTIVE

## 2016-07-25 LAB — TSH: TSH: 2.28 u[IU]/mL (ref 0.450–4.500)

## 2016-07-25 LAB — ABO

## 2016-07-25 LAB — RUBELLA SCREEN: Rubella Antibodies, IGG: 1.91 index (ref >0.99)

## 2016-07-25 LAB — HEPATITIS B SURFACE ANTIGEN: Hepatitis B Surface Ag: NEGATIVE

## 2016-07-25 LAB — VARICELLA ZOSTER ANTIBODY, IGG: Varicella zoster IgG: 1707 index (ref >165)

## 2016-07-26 LAB — GC/CHLAMYDIA PROBE AMP
Chlamydia trachomatis, NAA: NEGATIVE
NEISSERIA GONORRHOEAE BY PCR: NEGATIVE

## 2016-07-26 LAB — URINE CULTURE, OB REFLEX

## 2016-07-26 LAB — CULTURE, OB URINE

## 2016-07-27 LAB — URINALYSIS, ROUTINE W REFLEX MICROSCOPIC
Bilirubin, UA: NEGATIVE
GLUCOSE, UA: NEGATIVE
KETONES UA: NEGATIVE
LEUKOCYTES UA: NEGATIVE
Nitrite, UA: NEGATIVE
PROTEIN UA: NEGATIVE
RBC, UA: NEGATIVE
SPEC GRAV UA: 1.011 (ref 1.005–1.030)
Urobilinogen, Ur: 0.2 mg/dL (ref 0.2–1.0)
pH, UA: 6 (ref 5.0–7.5)

## 2016-07-27 LAB — MONITOR DRUG PROFILE 14(MW)
Amphetamine Scrn, Ur: NEGATIVE ng/mL
BARBITURATE SCREEN URINE: NEGATIVE ng/mL
BENZODIAZEPINE SCREEN, URINE: NEGATIVE ng/mL
BUPRENORPHINE, URINE: NEGATIVE ng/mL
CANNABINOIDS UR QL SCN: NEGATIVE ng/mL
COCAINE(METAB.)SCREEN, URINE: NEGATIVE ng/mL
Creatinine(Crt), U: 67.2 mg/dL (ref 20.0–300.0)
FENTANYL, URINE: NEGATIVE pg/mL
Meperidine Screen, Urine: NEGATIVE ng/mL
Methadone Screen, Urine: NEGATIVE ng/mL
OXYCODONE+OXYMORPHONE UR QL SCN: NEGATIVE ng/mL
Opiate Scrn, Ur: NEGATIVE ng/mL
PHENCYCLIDINE QUANTITATIVE URINE: NEGATIVE ng/mL
PROPOXYPHENE SCREEN URINE: NEGATIVE ng/mL
Ph of Urine: 5.7 (ref 4.5–8.9)
SPECIFIC GRAVITY: 1.009
Tramadol Screen, Urine: NEGATIVE ng/mL

## 2016-07-27 LAB — NICOTINE SCREEN, URINE: Cotinine Ql Scrn, Ur: NEGATIVE ng/mL

## 2016-07-29 ENCOUNTER — Ambulatory Visit (INDEPENDENT_AMBULATORY_CARE_PROVIDER_SITE_OTHER): Payer: Self-pay

## 2016-07-29 DIAGNOSIS — Z3687 Encounter for antenatal screening for uncertain dates: Secondary | ICD-10-CM

## 2016-07-29 DIAGNOSIS — Z3481 Encounter for supervision of other normal pregnancy, first trimester: Secondary | ICD-10-CM

## 2016-08-12 ENCOUNTER — Encounter: Payer: Self-pay | Admitting: Obstetrics and Gynecology

## 2016-08-15 ENCOUNTER — Encounter: Payer: Self-pay | Admitting: Obstetrics and Gynecology

## 2016-08-19 ENCOUNTER — Ambulatory Visit (INDEPENDENT_AMBULATORY_CARE_PROVIDER_SITE_OTHER): Payer: Self-pay | Admitting: Obstetrics and Gynecology

## 2016-08-19 ENCOUNTER — Encounter: Payer: Self-pay | Admitting: Obstetrics and Gynecology

## 2016-08-19 VITALS — BP 131/84 | HR 96 | Wt 212.5 lb

## 2016-08-19 DIAGNOSIS — Z1379 Encounter for other screening for genetic and chromosomal anomalies: Secondary | ICD-10-CM

## 2016-08-19 DIAGNOSIS — Z124 Encounter for screening for malignant neoplasm of cervix: Secondary | ICD-10-CM

## 2016-08-19 DIAGNOSIS — Z87898 Personal history of other specified conditions: Secondary | ICD-10-CM

## 2016-08-19 DIAGNOSIS — O2601 Excessive weight gain in pregnancy, first trimester: Secondary | ICD-10-CM

## 2016-08-19 DIAGNOSIS — E669 Obesity, unspecified: Secondary | ICD-10-CM

## 2016-08-19 DIAGNOSIS — Z3481 Encounter for supervision of other normal pregnancy, first trimester: Secondary | ICD-10-CM

## 2016-08-19 DIAGNOSIS — Z3482 Encounter for supervision of other normal pregnancy, second trimester: Secondary | ICD-10-CM | POA: Insufficient documentation

## 2016-08-19 DIAGNOSIS — Z8742 Personal history of other diseases of the female genital tract: Secondary | ICD-10-CM | POA: Insufficient documentation

## 2016-08-19 HISTORY — DX: Personal history of other diseases of the female genital tract: Z87.42

## 2016-08-19 LAB — POCT URINALYSIS DIPSTICK
Bilirubin, UA: NEGATIVE
Glucose, UA: NEGATIVE
KETONES UA: NEGATIVE
Leukocytes, UA: NEGATIVE
Nitrite, UA: NEGATIVE
PROTEIN UA: NEGATIVE
RBC UA: NEGATIVE
SPEC GRAV UA: 1.02
UROBILINOGEN UA: NEGATIVE
pH, UA: 6

## 2016-08-19 NOTE — Progress Notes (Signed)
OBSTETRIC INITIAL PRENATAL VISIT  Subjective:    Alicia Hendricks is being seen today for her first obstetrical visit.  This is not a planned pregnancy. She is a G2P1001 female at 1233w0d gestation, Estimated Date of Delivery: 03/10/17 with Patient's last menstrual period was 05/26/2016 (approximate). (inconsistent with 8 week sono). Her obstetrical history is significant for excessive weight gain and obesity. Relationship with FOB: spouse, living together. Patient does intend to breast feed. Pregnancy history fully reviewed.    Obstetric History   G2   P1   T1   P0   A0   L1    SAB0   TAB0   Ectopic0   Multiple0   Live Births1     # Outcome Date GA Lbr Len/2nd Weight Sex Delivery Anes PTL Lv  2 Current           1 Term 06/01/06 9869w0d  7 lb 8 oz (3.402 kg) M Vag-Spont  N LIV      Gynecologic History:  Last pap smear was 2-3 years ago .  Results were normal/abnormal.  Admits h/o abnormal pap smear x 1 in the past (mild abnormalities), f/u was normal.  Denies history of STIs.    Past Medical History:  Diagnosis Date  . Depression   . Migraines     Family History  Problem Relation Age of Onset  . Hyperlipidemia Mother   . Asthma Brother   . Diabetes Maternal Grandmother   . Heart failure Maternal Grandmother   . Stroke Maternal Grandmother   . Thyroid disease Other     Past Surgical History:  Procedure Laterality Date  . CHOLECYSTECTOMY      Social History   Social History  . Marital status: Single    Spouse name: N/A  . Number of children: N/A  . Years of education: N/A   Occupational History  . Not on file.   Social History Main Topics  . Smoking status: Former Smoker    Packs/day: 1.00    Types: Cigarettes    Quit date: 07/06/2016  . Smokeless tobacco: Never Used  . Alcohol use No  . Drug use: No  . Sexual activity: Yes    Partners: Male   Other Topics Concern  . Not on file   Social History Narrative  . No narrative on file     Current  Outpatient Prescriptions on File Prior to Visit  Medication Sig Dispense Refill  . Prenatal Multivit-Min-Fe-FA (PRENATAL VITAMINS) 0.8 MG tablet Take 1 tablet by mouth daily.     No current facility-administered medications on file prior to visit.      Allergies  Allergen Reactions  . Other Anaphylaxis    Review of Systems General:Present - Weight Gain.  Not Present- Fever, Weight Loss and . Skin:Not Present- Rash. HEENT:Not Present- Blurred Vision, Headache and Bleeding Gums. Respiratory:Not Present- Difficulty Breathing. Breast:Not Present- Breast Mass. Cardiovascular:Not Present- Chest Pain, Elevated Blood Pressure, Fainting / Blacking Out and Shortness of Breath. Gastrointestinal:Not Present- Abdominal Pain, Constipation, Nausea and Vomiting. Female Genitourinary:Not Present- Frequency, Painful Urination, Pelvic Pain, Vaginal Bleeding, Vaginal Discharge, Contractions, regular, Fetal Movements Decreased, Urinary Complaints and Vaginal Fluid. Musculoskeletal:Not Present- Back Pain and Leg Cramps. Neurological:Not Present- Dizziness. Psychiatric:Not Present- Depression.     Objective:   Blood pressure 131/84, pulse 96, weight 212 lb 8 oz (96.4 kg), last menstrual period 05/26/2016.  Body mass index is 37.64 kg/m.   General Appearance:    Alert, cooperative, no distress, appears stated age,  moderately obese  Head:    Normocephalic, without obvious abnormality, atraumatic  Eyes:    PERRL, conjunctiva/corneas clear, EOM's intact, both eyes  Ears:    Normal external ear canals, both ears  Nose:   Nares normal, septum midline, mucosa normal, no drainage or sinus tenderness  Throat:   Lips, mucosa, and tongue normal; teeth and gums normal  Neck:   Supple, symmetrical, trachea midline, no adenopathy; thyroid: no enlargement/tenderness/nodules; no carotid bruit or JVD  Back:     Symmetric, no curvature, ROM normal, no CVA tenderness  Lungs:     Clear to auscultation  bilaterally, respirations unlabored  Chest Wall:    No tenderness or deformity   Heart:    Regular rate and rhythm, S1 and S2 normal, no murmur, rub or gallop  Breast Exam:    No tenderness, masses, or nipple abnormality  Abdomen:     Soft, non-tender, bowel sounds active all four quadrants, no masses, no organomegaly.  FHT 162 bpm (noted on limited sono).  Genitalia:    Pelvic:external genitalia normal, vagina without lesions, discharge, or tenderness, rectovaginal septum  normal. Cervix normal in appearance, no cervical motion tenderness, no adnexal masses or tenderness.  Pregnancy positive findings: uterine enlargement: ~12 wk size, nontender.   Rectal:    Normal external sphincter.  No hemorrhoids appreciated. Internal exam not done.   Extremities:   Extremities normal, atraumatic, no cyanosis or edema  Pulses:   2+ and symmetric all extremities  Skin:   Skin color, texture, turgor normal, no rashes or lesions  Lymph nodes:   Cervical, supraclavicular, and axillary nodes normal  Neurologic:   CNII-XII intact, normal strength, sensation and reflexes throughout     Assessment:   Pregnancy at 11 and 0/7 weeks   Moderate obesity Excessive weight gain in first trimester H/o abnormal pap smear   Plan:   Initial labs reviewed, normal. Pap smear performed today. Prenatal vitamins encouraged. Problem list reviewed and updated. New OB counseling:  The patient has been given an overview regarding routine prenatal care.  Recommendations regarding diet, weight gain, and exercise in pregnancy were given.  Patient gained 27 lbs since conception. Discussed methods of decreasing further weight gain, encouraged exercise at least 3 times daily.  Total weight gain for current pregnancy should be no more than 30-35 lbs.  Prenatal testing, optional genetic testing, and ultrasound use in pregnancy were reviewed.  Desires MaterniT21.  Benefits of Breast Feeding were discussed. The patient is encouraged to  consider nursing her baby post partum. Offer flu vaccine next visit.  Follow up in 4 weeks.  50% of 30 min visit spent on counseling and coordination of care.    Hildred Laser, MD [facility]

## 2016-08-19 NOTE — Patient Instructions (Signed)
Eating Plan for Pregnant Women Introduction While you are pregnant, your body will require additional nutrition to help support your growing baby. It is recommended that you consume:  150 additional calories each day during your first trimester.  300 additional calories each day during your second trimester.  300 additional calories each day during your third trimester. Eating a healthy, well-balanced diet is very important for your health and for your baby's health. You also have a higher need for some vitamins and minerals, such as folic acid, calcium, iron, and vitamin D. What do I need to know about eating during pregnancy?  Do not try to lose weight or go on a diet during pregnancy.  Choose healthy, nutritious foods. Choose  of a sandwich with a glass of milk instead of a candy bar or a high-calorie sugar-sweetened beverage.  Limit your overall intake of foods that have "empty calories." These are foods that have little nutritional value, such as sweets, desserts, candies, sugar-sweetened beverages, and fried foods.  Eat a variety of foods, especially fruits and vegetables.  Take a prenatal vitamin to help meet the additional needs during pregnancy, specifically for folic acid, iron, calcium, and vitamin D.  Remember to stay active. Ask your health care provider for exercise recommendations that are specific to you.  Practice good food safety and cleanliness, such as washing your hands before you eat and after you prepare raw meat. This helps to prevent foodborne illnesses, such as listeriosis, that can be very dangerous for your baby. Ask your health care provider for more information about listeriosis. What does 150 extra calories look like? Healthy options for an additional 150 calories each day could be any of the following:  Plain low-fat yogurt (6-8 oz) with  cup of berries.  1 apple with 2 teaspoons of peanut butter.  Cut-up vegetables with  cup of hummus.  Low-fat  chocolate milk (8 oz or 1 cup).  1 string cheese with 1 medium orange.   of a peanut butter and jelly sandwich on whole-wheat bread (1 tsp of peanut butter). For 300 calories, you could eat two of those healthy options each day. What is a healthy amount of weight to gain? The recommended amount of weight for you to gain is based on your pre-pregnancy BMI. If your pre-pregnancy BMI was:  Less than 18 (underweight), you should gain 28-40 lb.  18-24.9 (normal), you should gain 25-35 lb.  25-29.9 (overweight), you should gain 15-25 lb.  Greater than 30 (obese), you should gain 11-20 lb. What if I am having twins or multiples? Generally, pregnant women who will be having twins or multiples may need to increase their daily calories by 300-600 calories each day. The recommended range for total weight gain is 25-54 lb, depending on your pre-pregnancy BMI. Talk with your health care provider for specific guidance about additional nutritional needs, weight gain, and exercise during your pregnancy. What foods can I eat? Grains  Any grains. Try to choose whole grains, such as whole-wheat bread, oatmeal, or brown rice. Vegetables  Any vegetables. Try to eat a variety of colors and types of vegetables to get a full range of vitamins and minerals. Remember to wash your vegetables well before eating. Fruits  Any fruits. Try to eat a variety of colors and types of fruit to get a full range of vitamins and minerals. Remember to wash your fruits well before eating. Meats and Other Protein Sources  Lean meats, including chicken, [place], fish, and lean cuts of  beef, veal, or pork. Make sure that all meats are cooked to "well done." Tofu. Tempeh. Beans. Eggs. Peanut butter and other nut butters. Seafood, such as shrimp, crab, and lobster. If you choose fish, select types that are higher in omega-3 fatty acids, including salmon, herring, mussels, trout, sardines, and pollock. Make sure that all meats are cooked  to food-safe temperatures. Dairy  Pasteurized milk and milk alternatives. Pasteurized yogurt and pasteurized cheese. Cottage cheese. Sour cream. Beverages  Water. Juices that contain 100% fruit juice or vegetable juice. Caffeine-free teas and decaffeinated coffee. Drinks that contain caffeine are okay to drink, but it is better to avoid caffeine. Keep your total caffeine intake to less than 200 mg each day (12 oz of coffee, tea, or soda) or as directed by your health care provider. Condiments  Any pasteurized condiments. Sweets and Desserts  Any sweets and desserts. Fats and Oils  Any fats and oils. The items listed above may not be a complete list of recommended foods or beverages. Contact your dietitian for more options.  What foods are not recommended? Vegetables  Unpasteurized (raw) vegetable juices. Fruits  Unpasteurized (raw) fruit juices. Meats and Other Protein Sources  Cured meats that have nitrates, such as bacon, salami, and hotdogs. Luncheon meats, bologna, or other deli meats (unless they are reheated until they are steaming hot). Refrigerated pate, meat spreads from a meat counter, smoked seafood that is found in the refrigerated section of a store. Raw fish, such as sushi or sashimi. High mercury content fish, such as tilefish, shark, swordfish, and king mackerel. Raw meats, such as tuna or beef tartare. Undercooked meats and poultry. Make sure that all meats are cooked to food-safe temperatures. Dairy  Unpasteurized (raw) milk and any foods that have raw milk in them. Soft cheeses, such as feta, queso blanco, queso fresco, Brie, Camembert cheeses, blue-veined cheeses, and Panela cheese (unless it is made with pasteurized milk, which must be stated on the label). Beverages  Alcohol. Sugar-sweetened beverages, such as sodas, teas, or energy drinks. Condiments  Homemade fermented foods and drinks, such as pickles, sauerkraut, or kombucha drinks. (Store-bought pasteurized versions  of these are okay.) Other  Salads that are made in the store, such as ham salad, chicken salad, egg salad, tuna salad, and seafood salad. The items listed above may not be a complete list of foods and beverages to avoid. Contact your dietitian for more information.  This information is not intended to replace advice given to you by your health care provider. Make sure you discuss any questions you have with your health care provider. Document Released: 04/06/2014 Document Revised: 11/28/2015 Document Reviewed: 12/05/2013  2017 Elsevier  

## 2016-08-21 LAB — PAP IG, CT-NG, RFX HPV ASCU
Chlamydia, Nuc. Acid Amp: NEGATIVE
Gonococcus by Nucleic Acid Amp: NEGATIVE
PAP Smear Comment: 0

## 2016-08-26 ENCOUNTER — Telehealth: Payer: Self-pay | Admitting: Obstetrics and Gynecology

## 2016-08-26 NOTE — Telephone Encounter (Signed)
Patient is [redacted] week pregnant and states she had light bleeding when she had a bowel movement last night. She didn't know if she should be worried. Please Advise.

## 2016-08-26 NOTE — Telephone Encounter (Signed)
Called pt she states that last night after BM she noticed a small amount of blood in the toilet ("one drop") pt notes that there was also one drop on the toilet paper. Pt states that she does not think she strained with BM. Advised pt on constipation and possible hemorrhoids. Advised pt on stool softner, adequate hydration, increasing fiber. Advised pt to monitor. Denies pain or bleeding at this time.

## 2016-08-28 ENCOUNTER — Encounter: Payer: Self-pay | Admitting: Obstetrics and Gynecology

## 2016-08-29 LAB — MATERNIT 21 PLUS CORE, BLOOD
CHROMOSOME 18: NEGATIVE
Chromosome 13: NEGATIVE
Chromosome 21: NEGATIVE
Y Chromosome: DETECTED

## 2016-08-31 ENCOUNTER — Telehealth: Payer: Self-pay

## 2016-08-31 NOTE — Telephone Encounter (Signed)
-----   Message from Hildred LaserAnika Cherry, MD sent at 08/29/2016  3:08 PM EST ----- Please inform patient of normal MaterniT21 screen.  Is a female fetus if she desires to know the sex.

## 2016-08-31 NOTE — Telephone Encounter (Signed)
Called pt no answer. LM for pt informing pt of negative genetic screening. Pt to call back for sex of baby.

## 2016-09-16 ENCOUNTER — Encounter: Payer: Self-pay | Admitting: Obstetrics and Gynecology

## 2016-09-16 ENCOUNTER — Ambulatory Visit (INDEPENDENT_AMBULATORY_CARE_PROVIDER_SITE_OTHER): Payer: Medicaid Other | Admitting: Obstetrics and Gynecology

## 2016-09-16 VITALS — BP 125/77 | HR 101 | Wt 221.3 lb

## 2016-09-16 DIAGNOSIS — Z3482 Encounter for supervision of other normal pregnancy, second trimester: Secondary | ICD-10-CM

## 2016-09-16 DIAGNOSIS — E669 Obesity, unspecified: Secondary | ICD-10-CM

## 2016-09-16 NOTE — Progress Notes (Signed)
ROB: Patient denies complaints, doing well. Declines flu vaccine.  Had normal MaterniT21 screen.  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  Will modify duties due to pregnancy and provide letter. RTC in 4 weeks, for anatomy scan at that time, needs spina bifida screen next visit (slightly early in gestation to perform today).

## 2016-09-24 ENCOUNTER — Ambulatory Visit (INDEPENDENT_AMBULATORY_CARE_PROVIDER_SITE_OTHER): Payer: Medicaid Other | Admitting: Obstetrics and Gynecology

## 2016-09-24 ENCOUNTER — Telehealth: Payer: Self-pay | Admitting: Obstetrics and Gynecology

## 2016-09-24 ENCOUNTER — Encounter: Payer: Self-pay | Admitting: Obstetrics and Gynecology

## 2016-09-24 VITALS — BP 130/79 | HR 97 | Wt 224.0 lb

## 2016-09-24 DIAGNOSIS — E669 Obesity, unspecified: Secondary | ICD-10-CM

## 2016-09-24 DIAGNOSIS — Z3492 Encounter for supervision of normal pregnancy, unspecified, second trimester: Secondary | ICD-10-CM

## 2016-09-24 DIAGNOSIS — O1202 Gestational edema, second trimester: Secondary | ICD-10-CM

## 2016-09-24 LAB — POCT URINALYSIS DIPSTICK
Bilirubin, UA: NEGATIVE
Glucose, UA: NEGATIVE
Ketones, UA: NEGATIVE
Leukocytes, UA: NEGATIVE
NITRITE UA: NEGATIVE
PH UA: 5 (ref 5.0–8.0)
Protein, UA: NEGATIVE
RBC UA: NEGATIVE
SPEC GRAV UA: 1.005 (ref 1.030–1.035)
UROBILINOGEN UA: NEGATIVE (ref ?–2.0)

## 2016-09-24 NOTE — Progress Notes (Signed)
Patient presents today with main complaint of painful swelling in her ankles bilateral.   She states that she works and is on her feet all day at the end of the day her feet are swollen. This resolves by morning. We have discussed swelling in detail - multiple strategies discussed including modification of fluid intake,  modification ofsalt intake, use of Ted support hose.

## 2016-09-24 NOTE — Telephone Encounter (Signed)
Pt being seen in office today by Dr.Evans.

## 2016-09-24 NOTE — Telephone Encounter (Signed)
Pt states 16 weeks. Pt states ankles and feet swell and are painful when she walks.Pt states she understands feet swell during pregnancy but unsure if they should hurt like they do. Please advise.

## 2016-10-08 ENCOUNTER — Telehealth: Payer: Self-pay | Admitting: Obstetrics and Gynecology

## 2016-10-08 DIAGNOSIS — O35AXX0 Maternal care for other (suspected) fetal abnormality and damage, fetal facial anomalies, not applicable or unspecified: Secondary | ICD-10-CM

## 2016-10-08 DIAGNOSIS — O358XX0 Maternal care for other (suspected) fetal abnormality and damage, not applicable or unspecified: Secondary | ICD-10-CM

## 2016-10-08 NOTE — Telephone Encounter (Signed)
Please have patient scheduled for an appointment as soon as possible to discuss [place] ultrasound findings. I will also put in a referral to [facility].

## 2016-10-08 NOTE — Telephone Encounter (Signed)
Patient had [place] yesterday and a cleft lip was present and she just wanted to give you a heads up - patient had concerns due to father having cleft lip at birth - mother was not made aware.

## 2016-10-15 ENCOUNTER — Other Ambulatory Visit: Payer: Self-pay | Admitting: Obstetrics and Gynecology

## 2016-10-15 DIAGNOSIS — O30009 Twin pregnancy, unspecified number of placenta and unspecified number of amniotic sacs, unspecified trimester: Secondary | ICD-10-CM

## 2016-10-15 DIAGNOSIS — Z3689 Encounter for other specified antenatal screening: Secondary | ICD-10-CM

## 2016-10-21 ENCOUNTER — Encounter: Payer: Medicaid Other | Admitting: Obstetrics and Gynecology

## 2016-10-21 ENCOUNTER — Ambulatory Visit (INDEPENDENT_AMBULATORY_CARE_PROVIDER_SITE_OTHER): Payer: Medicaid Other | Admitting: Obstetrics and Gynecology

## 2016-10-21 ENCOUNTER — Encounter: Payer: Self-pay | Admitting: Obstetrics and Gynecology

## 2016-10-21 ENCOUNTER — Ambulatory Visit (INDEPENDENT_AMBULATORY_CARE_PROVIDER_SITE_OTHER): Payer: Medicaid Other

## 2016-10-21 VITALS — BP 114/74 | HR 84 | Wt 223.4 lb

## 2016-10-21 DIAGNOSIS — Z3482 Encounter for supervision of other normal pregnancy, second trimester: Secondary | ICD-10-CM | POA: Diagnosis not present

## 2016-10-21 DIAGNOSIS — O35AXX0 Maternal care for other (suspected) fetal abnormality and damage, fetal facial anomalies, not applicable or unspecified: Secondary | ICD-10-CM

## 2016-10-21 DIAGNOSIS — Z3492 Encounter for supervision of normal pregnancy, unspecified, second trimester: Secondary | ICD-10-CM

## 2016-10-21 DIAGNOSIS — O358XX0 Maternal care for other (suspected) fetal abnormality and damage, not applicable or unspecified: Secondary | ICD-10-CM

## 2016-10-21 LAB — POCT URINALYSIS DIPSTICK
BILIRUBIN UA: NEGATIVE
Blood, UA: NEGATIVE
Glucose, UA: NEGATIVE
Ketones, UA: NEGATIVE
Leukocytes, UA: NEGATIVE
NITRITE UA: NEGATIVE
PH UA: 6.5 (ref 5.0–8.0)
Protein, UA: NEGATIVE
Spec Grav, UA: <=1.005 — AB (ref 1.010–1.025)
Urobilinogen, UA: NEGATIVE E.U./dL — AB

## 2016-10-21 NOTE — Progress Notes (Signed)
ROB:  U/S = cleft lip noted.  F/U at [facility] scheduled for next week.  Pt was aware - FOB with cleft lip. No other problems.  Active fetal mvt.

## 2016-10-29 ENCOUNTER — Ambulatory Visit
Admission: RE | Admit: 2016-10-29 | Discharge: 2016-10-29 | Disposition: A | Discharge status: Home / Self Care | Payer: Medicaid Other | Source: Ambulatory Visit | Attending: Maternal & Fetal Medicine | Admitting: Maternal & Fetal Medicine

## 2016-10-29 VITALS — Wt 226.4 lb

## 2016-10-29 DIAGNOSIS — O358XX1 Maternal care for other (suspected) fetal abnormality and damage, fetus 1: Secondary | ICD-10-CM | POA: Diagnosis not present

## 2016-10-29 DIAGNOSIS — Z8279 Family history of other congenital malformations, deformations and chromosomal abnormalities: Secondary | ICD-10-CM | POA: Diagnosis not present

## 2016-10-29 DIAGNOSIS — Z3A28 28 weeks gestation of pregnancy: Secondary | ICD-10-CM | POA: Insufficient documentation

## 2016-10-29 DIAGNOSIS — O35AXX0 Maternal care for other (suspected) fetal abnormality and damage, fetal facial anomalies, not applicable or unspecified: Secondary | ICD-10-CM

## 2016-10-29 DIAGNOSIS — O30009 Twin pregnancy, unspecified number of placenta and unspecified number of amniotic sacs, unspecified trimester: Secondary | ICD-10-CM

## 2016-10-29 DIAGNOSIS — O358XX0 Maternal care for other (suspected) fetal abnormality and damage, not applicable or unspecified: Secondary | ICD-10-CM

## 2016-10-29 DIAGNOSIS — Z3689 Encounter for other specified antenatal screening: Secondary | ICD-10-CM | POA: Insufficient documentation

## 2016-10-29 DIAGNOSIS — O35AXX1 Maternal care for other (suspected) fetal abnormality and damage, fetal facial anomalies, fetus 1: Secondary | ICD-10-CM | POA: Insufficient documentation

## 2016-10-29 NOTE — Progress Notes (Signed)
Patient seen and evaluated by me, agree with assessment and plan as outlined by [facility].

## 2016-10-29 NOTE — Progress Notes (Signed)
Referring physician: [facility] 30 Minute Consultation  Alicia Hendricks was referred to [facility] of  for genetic counseling and targeted ultrasound because of the previous ultrasound findings of bilateral cleft lip.  This note summarizes the information we discussed.    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 The remainder of the fetal anatomy was seen and appeared normal.   In typical embryological development, the fetal lip closes by 7-[redacted] weeks gestation and the fetal palate closes by [redacted] weeks gestation.  When parts of these structures do not fuse as they should, cleft lip and/or palate (CL/P) results.  Because clefting is known to be associated with an increased chance for other birth defects as well as chromosome conditions, the patient had cell free fetal DNA testing performed prior to this visit, which was normal.  These results, coupled with the fact that the remainder of the anatomy and growth appear normal, significantly reduces the chance for aneuploidy.  Approximately 25% (1 in 4) of all cleft lip and/or palate (CL/P) cases are cleft lip only, 50% (1 in 2) are cleft lip and palate, and 25% (1 in 4) are cleft palate only.  The incidence of CL/P varies in different ethnic populations; it occurs in approximately 1 in 1,000 Caucasian births and in approximately 1 in 28 [place] births.  In addition to ethnicity, other factors may increase the chance of a CL/P including some prenatal exposures, alcohol and drug use, cigarette smoking, or folic acid deficiency.  CL/P is most often an isolated condition, but can be present in combination with other birth defects possibly as part of a genetic syndrome.  7-13% of individuals with a cleft lip and 11-14% of individuals with a cleft lip and palate are born with additional birth defects.  Many  genetic syndromes are associated with cleft lip and/or palate which may not be identified on ultrasound and would not be detected by amniocentesis.  For this reason, a genetics evaluation may be recommended sometime after birth in order to assess for an underlying genetic syndrome.  When there is no syndrome as the cause, then the cleft lip or palate is usually thought to be caused by a combination of genetic and environmental factors, called multifactorial inheritance.  However, there are also families in which there appears to be a dominant genetic trait for isolated clefting or in which the genetic factors are very strong.  This family appears to follow this inheritance (see below).  We would estimate given the family history that each pregnancy for this couple may have up to a 50% chance for clefting.    In the family history, the father of the baby Alicia Hendricks, was born with bilateral cleft lip and palate.  His maternal half brother had unilateral cleft lip and palate, and his brother's daughter had unilateral cleft lip.  All of these individuals are otherwise in good health, with normal cognitive development and no other birth differences.  The remainder of the family history was remarkable for Alicia Hendricks's full brother who passed away from SIDS at 76 month of age.  No health problems were known and no cause was identified.  His other two brothers and half sister are in good health.  Because SIDS may happen for various reasons, it is difficult to determine the chance for recurrence without additional medical information on that child.  If more is learned, we are  happy to review this further.  The remainder of the family history was unremarkable for developmental differences, birth defects, recurrent pregnancy loss or known genetic conditions.    At the time of her visit, the possibility of arranging a prenatal contact with the cleft lip and palate team at [facility] and a lactation consult, should  Alicia Hendricks plan to breastfeed, were discussed.  Surgery is required in all individuals with a cleft lip and/or palate.  Surgical correction is different for each individual and may vary depending on the severity of the cleft.  The prognosis for a good cosmetic and functional repair of an isolated CL/P is excellent.  Otherwise, prognosis is dependent upon the presence of other birth defects present or underlying condition identified.  We also offered the option of a fetal echocardiogram to assess for structural heart conditions, as these may be more common in individuals with clefting.  It is important to remember that each pregnancy in the general population has a 3% chance for a birth defect that might not be detected prenatally.  We recommend 0.4 mg of folic acid, a B-complex vitamin, for all women of childbearing age.  Folic acid may help to prevent neural tube defects and should be taken prior to and during pregnancy.  Folic acid supplementation is also thought to add some protective effect for cleft lip and/or palate, though this is difficult to determine particularly in light of the family history.    At this time, we recommend a follow-up ultrasound for Alicia Hendricks to be performed in the third trimester to evaluate fetal growth and to attempt to visualize the fetal palate. We will contact the offices for a fetal echocardiogram as well as the third trimester cleft lip/palate team consultation.  She declined carrier screening for CF and SMA, which were offered as part of routine genetic counseling.    Please contact us at [phone] to address any questions or concerns that the patient might have.  We greatly appreciate the opportunity to assist you in the prenatal care of this family.    Cherly Anderson, MS, CGC

## 2016-11-18 ENCOUNTER — Ambulatory Visit (INDEPENDENT_AMBULATORY_CARE_PROVIDER_SITE_OTHER): Payer: Medicaid Other | Admitting: Obstetrics and Gynecology

## 2016-11-18 VITALS — BP 113/69 | HR 98 | Wt 230.3 lb

## 2016-11-18 DIAGNOSIS — Z131 Encounter for screening for diabetes mellitus: Secondary | ICD-10-CM

## 2016-11-18 DIAGNOSIS — Z3482 Encounter for supervision of other normal pregnancy, second trimester: Secondary | ICD-10-CM

## 2016-11-18 DIAGNOSIS — Z130 Encounter for screening for diseases of the blood and blood-forming organs and certain disorders involving the immune mechanism: Secondary | ICD-10-CM

## 2016-11-18 DIAGNOSIS — O358XX1 Maternal care for other (suspected) fetal abnormality and damage, fetus 1: Secondary | ICD-10-CM

## 2016-11-18 DIAGNOSIS — O35AXX1 Maternal care for other (suspected) fetal abnormality and damage, fetal facial anomalies, fetus 1: Secondary | ICD-10-CM

## 2016-11-18 LAB — POCT URINALYSIS DIPSTICK
BILIRUBIN UA: NEGATIVE
Blood, UA: NEGATIVE
GLUCOSE UA: NEGATIVE
KETONES UA: NEGATIVE
Nitrite, UA: NEGATIVE
PROTEIN UA: NEGATIVE
SPEC GRAV UA: 1.01 (ref 1.010–1.025)
Urobilinogen, UA: 0.2 E.U./dL
pH, UA: 6 (ref 5.0–8.0)

## 2016-11-18 NOTE — Progress Notes (Signed)
ROB: Doing well, no complaints.  [facility] noted isolated finding of fetal cleft lip. Has echo scheduled for next Monday (precautionary). Has information packet for scheduling repair of the cleft lip once patient delivers. RTC in 4 weeks, for 28 week labs at that time.

## 2016-12-07 ENCOUNTER — Other Ambulatory Visit: Payer: Self-pay | Admitting: *Deleted

## 2016-12-07 DIAGNOSIS — Q379 Unspecified cleft palate with unilateral cleft lip: Secondary | ICD-10-CM

## 2016-12-10 ENCOUNTER — Ambulatory Visit
Admission: RE | Admit: 2016-12-10 | Discharge: 2016-12-10 | Disposition: A | Discharge status: Home / Self Care | Payer: Managed Care, Other (non HMO) | Source: Ambulatory Visit | Attending: Maternal and Fetal Medicine | Admitting: Maternal and Fetal Medicine

## 2016-12-10 DIAGNOSIS — Q379 Unspecified cleft palate with unilateral cleft lip: Secondary | ICD-10-CM | POA: Diagnosis not present

## 2016-12-10 DIAGNOSIS — Z3A27 27 weeks gestation of pregnancy: Secondary | ICD-10-CM | POA: Insufficient documentation

## 2016-12-18 ENCOUNTER — Other Ambulatory Visit: Payer: Medicaid Other

## 2016-12-18 ENCOUNTER — Encounter: Payer: Medicaid Other | Admitting: Obstetrics and Gynecology

## 2016-12-18 ENCOUNTER — Ambulatory Visit (INDEPENDENT_AMBULATORY_CARE_PROVIDER_SITE_OTHER): Payer: Medicaid Other | Admitting: Obstetrics and Gynecology

## 2016-12-18 VITALS — BP 118/73 | HR 97 | Wt 233.7 lb

## 2016-12-18 DIAGNOSIS — Z3483 Encounter for supervision of other normal pregnancy, third trimester: Secondary | ICD-10-CM

## 2016-12-18 DIAGNOSIS — Z23 Encounter for immunization: Secondary | ICD-10-CM

## 2016-12-18 DIAGNOSIS — Z130 Encounter for screening for diseases of the blood and blood-forming organs and certain disorders involving the immune mechanism: Secondary | ICD-10-CM

## 2016-12-18 DIAGNOSIS — Z131 Encounter for screening for diabetes mellitus: Secondary | ICD-10-CM

## 2016-12-18 LAB — POCT URINALYSIS DIPSTICK
BILIRUBIN UA: NEGATIVE
GLUCOSE UA: NEGATIVE
Ketones, UA: 160
NITRITE UA: NEGATIVE
PH UA: 6.5 (ref 5.0–8.0)
Protein, UA: NEGATIVE
RBC UA: NEGATIVE
Spec Grav, UA: 1.015 (ref 1.010–1.025)
UROBILINOGEN UA: 0.2 U/dL

## 2016-12-18 MED ORDER — TETANUS-DIPHTH-ACELL PERTUSSIS 5-2.5-18.5 LF-MCG/0.5 IM SUSP
0.5000 mL | Freq: Once | INTRAMUSCULAR | Status: AC
  Administered 2016-12-18: 0.5 mL via INTRAMUSCULAR

## 2016-12-18 NOTE — Progress Notes (Signed)
ROB: Doing well, no complaints. Is s/p normal fetal echo.  Notes that she is recommended to return for follow up the fetal cardiologist after delivery. For 28 week labs today.  Desires to breast and bottle feed (due to cleft lip),  desires unsure method for contraception. For Tdap today, signed blood consent, discussed cord blood banking. RTC in 2 weeks.

## 2016-12-19 ENCOUNTER — Encounter: Payer: Self-pay | Admitting: Obstetrics and Gynecology

## 2016-12-19 LAB — CBC
HEMATOCRIT: 34.6 % (ref 34.0–46.6)
HEMOGLOBIN: 11.5 g/dL (ref 11.1–15.9)
MCH: 27.8 pg (ref 26.6–33.0)
MCHC: 33.2 g/dL (ref 31.5–35.7)
MCV: 84 fL (ref 79–97)
Platelets: 324 10*3/uL (ref 150–379)
RBC: 4.14 x10E6/uL (ref 3.77–5.28)
RDW: 13.9 % (ref 12.3–15.4)
WBC: 12 10*3/uL — ABNORMAL HIGH (ref 3.4–10.8)

## 2016-12-19 LAB — GLUCOSE, 1 HOUR GESTATIONAL: GESTATIONAL DIABETES SCREEN: 107 mg/dL (ref 65–139)

## 2017-01-04 ENCOUNTER — Other Ambulatory Visit: Payer: Self-pay | Admitting: *Deleted

## 2017-01-04 ENCOUNTER — Ambulatory Visit (INDEPENDENT_AMBULATORY_CARE_PROVIDER_SITE_OTHER): Payer: Medicaid Other | Admitting: Obstetrics and Gynecology

## 2017-01-04 ENCOUNTER — Encounter: Payer: Self-pay | Admitting: Obstetrics and Gynecology

## 2017-01-04 VITALS — BP 113/71 | HR 88 | Wt 237.1 lb

## 2017-01-04 DIAGNOSIS — Z3493 Encounter for supervision of normal pregnancy, unspecified, third trimester: Secondary | ICD-10-CM

## 2017-01-04 DIAGNOSIS — O35AXX0 Maternal care for other (suspected) fetal abnormality and damage, fetal facial anomalies, not applicable or unspecified: Secondary | ICD-10-CM

## 2017-01-04 DIAGNOSIS — O358XX0 Maternal care for other (suspected) fetal abnormality and damage, not applicable or unspecified: Secondary | ICD-10-CM

## 2017-01-04 LAB — POCT URINALYSIS DIPSTICK
BILIRUBIN UA: NEGATIVE
Blood, UA: NEGATIVE
Glucose, UA: NEGATIVE
Ketones, UA: NEGATIVE
Leukocytes, UA: NEGATIVE
NITRITE UA: NEGATIVE
Protein, UA: NEGATIVE
Spec Grav, UA: >=1.03 — AB (ref 1.010–1.025)
Urobilinogen, UA: 0.2 E.U./dL
pH, UA: 5 (ref 5.0–8.0)

## 2017-01-04 NOTE — Progress Notes (Signed)
ROB:  No problems - Echo normal.  F/U [facility] next week as scheduled.  Size> Dates - [facility]

## 2017-01-07 ENCOUNTER — Ambulatory Visit
Admission: RE | Admit: 2017-01-07 | Discharge: 2017-01-07 | Disposition: A | Discharge status: Home / Self Care | Payer: Medicaid Other | Source: Ambulatory Visit | Attending: Maternal & Fetal Medicine | Admitting: Maternal & Fetal Medicine

## 2017-01-07 DIAGNOSIS — O358XX0 Maternal care for other (suspected) fetal abnormality and damage, not applicable or unspecified: Secondary | ICD-10-CM | POA: Diagnosis not present

## 2017-01-07 DIAGNOSIS — O35AXX0 Maternal care for other (suspected) fetal abnormality and damage, fetal facial anomalies, not applicable or unspecified: Secondary | ICD-10-CM

## 2017-01-07 DIAGNOSIS — Z3A31 31 weeks gestation of pregnancy: Secondary | ICD-10-CM | POA: Diagnosis not present

## 2017-01-19 ENCOUNTER — Ambulatory Visit (INDEPENDENT_AMBULATORY_CARE_PROVIDER_SITE_OTHER): Payer: Medicaid Other | Admitting: Obstetrics and Gynecology

## 2017-01-19 VITALS — BP 124/71 | HR 114 | Wt 234.8 lb

## 2017-01-19 DIAGNOSIS — O99613 Diseases of the digestive system complicating pregnancy, third trimester: Secondary | ICD-10-CM

## 2017-01-19 DIAGNOSIS — Z3483 Encounter for supervision of other normal pregnancy, third trimester: Secondary | ICD-10-CM

## 2017-01-19 DIAGNOSIS — O3663X0 Maternal care for excessive fetal growth, third trimester, not applicable or unspecified: Secondary | ICD-10-CM

## 2017-01-19 DIAGNOSIS — K219 Gastro-esophageal reflux disease without esophagitis: Secondary | ICD-10-CM

## 2017-01-19 LAB — POCT URINALYSIS DIPSTICK
BILIRUBIN UA: NEGATIVE
GLUCOSE UA: NEGATIVE
KETONES UA: NEGATIVE
Nitrite, UA: NEGATIVE
RBC UA: NEGATIVE
Spec Grav, UA: 1.015 (ref 1.010–1.025)
Urobilinogen, UA: 0.2 E.U./dL
pH, UA: 6 (ref 5.0–8.0)

## 2017-01-19 NOTE — Progress Notes (Signed)
ROB: Paitent c/o acid reflux, advised on Tums prn.  Concerned regarding fetal macrosomia noted on last MFM ultrasound on 01/07/17  (EFW 97%ile, 4 lbs 13 oz, normal AFI).  Has had 35 lb weight gain thus far in pregnancy (lost 3 lbs since last visit).  Discussed dietary modifications, offered referral to [facility], exercise in pregnancy. Had normal glucose screen. Scheduled for repeat scan in 4 weeks. RTC in 2 weeks.

## 2017-02-01 ENCOUNTER — Other Ambulatory Visit: Payer: Self-pay | Admitting: *Deleted

## 2017-02-01 DIAGNOSIS — O35AXX0 Maternal care for other (suspected) fetal abnormality and damage, fetal facial anomalies, not applicable or unspecified: Secondary | ICD-10-CM

## 2017-02-01 DIAGNOSIS — O358XX0 Maternal care for other (suspected) fetal abnormality and damage, not applicable or unspecified: Secondary | ICD-10-CM

## 2017-02-02 ENCOUNTER — Encounter: Payer: Self-pay | Admitting: Obstetrics and Gynecology

## 2017-02-02 ENCOUNTER — Ambulatory Visit (INDEPENDENT_AMBULATORY_CARE_PROVIDER_SITE_OTHER): Payer: Medicaid Other | Admitting: Obstetrics and Gynecology

## 2017-02-02 VITALS — BP 121/81 | HR 96 | Wt 239.6 lb

## 2017-02-02 DIAGNOSIS — Z3493 Encounter for supervision of normal pregnancy, unspecified, third trimester: Secondary | ICD-10-CM

## 2017-02-02 DIAGNOSIS — O3663X0 Maternal care for excessive fetal growth, third trimester, not applicable or unspecified: Secondary | ICD-10-CM

## 2017-02-02 DIAGNOSIS — Z3483 Encounter for supervision of other normal pregnancy, third trimester: Secondary | ICD-10-CM

## 2017-02-02 LAB — POCT URINALYSIS DIPSTICK
BILIRUBIN UA: NEGATIVE
Blood, UA: NEGATIVE
Glucose, UA: NEGATIVE
Ketones, UA: NEGATIVE
LEUKOCYTES UA: NEGATIVE
NITRITE UA: NEGATIVE
PH UA: 7 (ref 5.0–8.0)
PROTEIN UA: NEGATIVE
Spec Grav, UA: <=1.005 — AB (ref 1.010–1.025)
UROBILINOGEN UA: 0.2 U/dL

## 2017-02-02 NOTE — Addendum Note (Signed)
Addended by: Brooke DareSICK, Jeffree Cazeau L on: 02/02/2017 09:45 AM   Modules accepted: Orders

## 2017-02-02 NOTE — Progress Notes (Signed)
ROB:  No complaints.  Long discussion regarding fetal macrosomia.  Discussed possible CD based on weight at end of pregnancy.  Used 3 cutoffs and R/B of each.  [phone])  Pt had a mid 7lbs baby last time and she said he got stuck under her pubic bone and she had to have a vacuum delivery.  GC/CT GBS done today.  Begin NSTs 1X per week next week.  [facility]

## 2017-02-04 ENCOUNTER — Ambulatory Visit
Admission: RE | Admit: 2017-02-04 | Discharge: 2017-02-04 | Disposition: A | Discharge status: Home / Self Care | Payer: Medicaid Other | Source: Ambulatory Visit | Attending: Obstetrics and Gynecology | Admitting: Obstetrics and Gynecology

## 2017-02-04 VITALS — BP 130/72 | HR 94 | Temp 97.7°F | Resp 18 | Wt 239.8 lb

## 2017-02-04 DIAGNOSIS — O35AXX0 Maternal care for other (suspected) fetal abnormality and damage, fetal facial anomalies, not applicable or unspecified: Secondary | ICD-10-CM

## 2017-02-04 DIAGNOSIS — Z3A35 35 weeks gestation of pregnancy: Secondary | ICD-10-CM | POA: Diagnosis not present

## 2017-02-04 DIAGNOSIS — O358XX1 Maternal care for other (suspected) fetal abnormality and damage, fetus 1: Secondary | ICD-10-CM | POA: Diagnosis not present

## 2017-02-04 DIAGNOSIS — O35AXX1 Maternal care for other (suspected) fetal abnormality and damage, fetal facial anomalies, fetus 1: Secondary | ICD-10-CM

## 2017-02-04 DIAGNOSIS — O3663X0 Maternal care for excessive fetal growth, third trimester, not applicable or unspecified: Secondary | ICD-10-CM | POA: Insufficient documentation

## 2017-02-04 DIAGNOSIS — O358XX0 Maternal care for other (suspected) fetal abnormality and damage, not applicable or unspecified: Secondary | ICD-10-CM

## 2017-02-04 LAB — STREP GP B NAA: Strep Gp B NAA: NEGATIVE

## 2017-02-05 LAB — GC/CHLAMYDIA PROBE AMP
CHLAMYDIA, DNA PROBE: NEGATIVE
NEISSERIA GONORRHOEAE BY PCR: NEGATIVE

## 2017-02-08 ENCOUNTER — Ambulatory Visit (INDEPENDENT_AMBULATORY_CARE_PROVIDER_SITE_OTHER): Payer: Medicaid Other | Admitting: Obstetrics and Gynecology

## 2017-02-08 ENCOUNTER — Other Ambulatory Visit: Payer: Medicaid Other

## 2017-02-08 DIAGNOSIS — O358XX1 Maternal care for other (suspected) fetal abnormality and damage, fetus 1: Secondary | ICD-10-CM

## 2017-02-08 DIAGNOSIS — O35AXX1 Maternal care for other (suspected) fetal abnormality and damage, fetal facial anomalies, fetus 1: Secondary | ICD-10-CM

## 2017-02-08 DIAGNOSIS — O3663X0 Maternal care for excessive fetal growth, third trimester, not applicable or unspecified: Secondary | ICD-10-CM | POA: Diagnosis not present

## 2017-02-08 NOTE — Progress Notes (Signed)
NONSTRESS TEST INTERPRETATION  INDICATIONS: Macrosomia, Decreased fetal movement and Obesity  FHR baseline: 120bpm RESULTS:Reactive COMMENTS: No contractions   PLAN: 1. Continue fetal kick counts twice a day. 2. Continue antepartum testing as scheduled-weekly   Debbe Baleskinawa Marillyn Goren, CMA

## 2017-02-09 ENCOUNTER — Encounter: Payer: Medicaid Other | Admitting: Obstetrics and Gynecology

## 2017-02-09 ENCOUNTER — Other Ambulatory Visit: Payer: Medicaid Other

## 2017-02-12 ENCOUNTER — Encounter: Payer: Self-pay | Admitting: Obstetrics and Gynecology

## 2017-02-16 ENCOUNTER — Ambulatory Visit (INDEPENDENT_AMBULATORY_CARE_PROVIDER_SITE_OTHER): Payer: Medicaid Other | Admitting: Obstetrics and Gynecology

## 2017-02-16 ENCOUNTER — Encounter: Payer: Self-pay | Admitting: Obstetrics and Gynecology

## 2017-02-16 VITALS — BP 114/79 | HR 97 | Wt 238.1 lb

## 2017-02-16 DIAGNOSIS — Z3483 Encounter for supervision of other normal pregnancy, third trimester: Secondary | ICD-10-CM

## 2017-02-16 LAB — POCT URINALYSIS DIPSTICK
Bilirubin, UA: NEGATIVE
Glucose, UA: NEGATIVE
Ketones, UA: NEGATIVE
LEUKOCYTES UA: NEGATIVE
Nitrite, UA: NEGATIVE
PH UA: 6 (ref 5.0–8.0)
PROTEIN UA: NEGATIVE
RBC UA: NEGATIVE
Spec Grav, UA: 1.015 (ref 1.010–1.025)
UROBILINOGEN UA: 0.2 U/dL

## 2017-02-16 NOTE — Patient Instructions (Addendum)
Sciatica Rehab  Ask your health care provider which exercises are safe for you. Do exercises exactly as told by your health care provider and adjust them as directed. It is normal to feel mild stretching, pulling, tightness, or discomfort as you do these exercises, but you should stop right away if you feel sudden pain or your pain gets worse. Do not begin these exercises until told by your health care provider.  Stretching and range of motion exercises  These exercises warm up your muscles and joints and improve the movement and flexibility of your hips and your back. These exercises also help to relieve pain, numbness, and tingling.  Exercise A: Sciatic nerve glide  1. Sit in a chair with your head facing down toward your chest. Place your hands behind your back. Let your shoulders slump forward.  2. Slowly straighten one of your knees while you tilt your head back as if you are looking toward the ceiling. Only straighten your leg as far as you can without making your symptoms worse.  3. Hold for __________ seconds.  4. Slowly return to the starting position.  5. Repeat with your other leg.  Repeat __________ times. Complete this exercise __________ times a day.  Exercise B: Knee to chest with hip adduction and internal rotation    1. Lie on your back on a firm surface with both legs straight.  2. Bend one of your knees and move it up toward your chest until you feel a gentle stretch in your lower back and buttock. Then, move your knee toward the shoulder that is on the opposite side from your leg.  ? Hold your leg in this position by holding onto the front of your knee.  3. Hold for __________ seconds.  4. Slowly return to the starting position.  5. Repeat with your other leg.  Repeat __________ times. Complete this exercise __________ times a day.  Exercise C: Prone extension on elbows    1. Lie on your abdomen on a firm surface. A bed may be too soft for this exercise.  2. Prop yourself up on your  elbows.  3. Use your arms to help lift your chest up until you feel a gentle stretch in your abdomen and your lower back.  ? This will place some of your body weight on your elbows. If this is uncomfortable, try stacking pillows under your chest.  ? Your hips should stay down, against the surface that you are lying on. Keep your hip and back muscles relaxed.  4. Hold for __________ seconds.  5. Slowly relax your upper body and return to the starting position.  Repeat __________ times. Complete this exercise __________ times a day.  Strengthening exercises  These exercises build strength and endurance in your back. Endurance is the ability to use your muscles for a long time, even after they get tired.  Exercise D: Pelvic tilt  1. Lie on your back on a firm surface. Bend your knees and keep your feet flat.  2. Tense your abdominal muscles. Tip your pelvis up toward the ceiling and flatten your lower back into the floor.  ? To help with this exercise, you may place a small towel under your lower back and try to push your back into the towel.  3. Hold for __________ seconds.  4. Let your muscles relax completely before you repeat this exercise.  Repeat __________ times. Complete this exercise __________ times a day.  Exercise E: Alternating arm and leg raises      1. Get on your hands and knees on a firm surface. If you are on a hard floor, you may want to use padding to cushion your knees, such as an exercise mat.  2. Line up your arms and legs. Your hands should be below your shoulders, and your knees should be below your hips.  3. Lift your left leg behind you. At the same time, raise your right arm and straighten it in front of you.  ? Do not lift your leg higher than your hip.  ? Do not lift your arm higher than your shoulder.  ? Keep your abdominal and back muscles tight.  ? Keep your hips facing the ground.  ? Do not arch your back.  ? Keep your balance carefully, and do not hold your breath.  4. Hold for  __________ seconds.  5. Slowly return to the starting position and repeat with your right leg and your left arm.  Repeat __________ times. Complete this exercise __________ times a day.  Posture and body mechanics    Body mechanics refers to the movements and positions of your body while you do your daily activities. Posture is part of body mechanics. Good posture and healthy body mechanics can help to relieve stress in your body's tissues and joints. Good posture means that your spine is in its natural S-curve position (your spine is neutral), your shoulders are pulled back slightly, and your head is not tipped forward. The following are general guidelines for applying improved posture and body mechanics to your everyday activities.  Standing    · When standing, keep your spine neutral and your feet about hip-width apart. Keep a slight bend in your knees. Your ears, shoulders, and hips should line up.  · When you do a task in which you stand in one place for a long time, place one foot up on a stable object that is 2-4 inches (5-10 cm) high, such as a footstool. This helps keep your spine neutral.  Sitting    · When sitting, keep your spine neutral and keep your feet flat on the floor. Use a footrest, if necessary, and keep your thighs parallel to the floor. Avoid rounding your shoulders, and avoid tilting your head forward.  · When working at a desk or a computer, keep your desk at a height where your hands are slightly lower than your elbows. Slide your chair under your desk so you are close enough to maintain good posture.  · When working at a computer, place your monitor at a height where you are looking straight ahead and you do not have to tilt your head forward or downward to look at the screen.  Resting    · When lying down and resting, avoid positions that are most painful for you.  · If you have pain with activities such as sitting, bending, stooping, or squatting (flexion-based activities), lie in a  position in which your body does not bend very much. For example, avoid curling up on your side with your arms and knees near your chest (fetal position).  · If you have pain with activities such as standing for a long time or reaching with your arms (extension-based activities), lie with your spine in a neutral position and bend your knees slightly. Try the following positions:  ? Lying on your side with a pillow between your knees.  ? Lying on your back with a pillow under your knees.  Lifting    · When lifting   objects, keep your feet at least shoulder-width apart and tighten your abdominal muscles.  · Bend your knees and hips and keep your spine neutral. It is important to lift using the strength of your legs, not your back. Do not lock your knees straight out.  · Always ask for help to lift heavy or awkward objects.  This information is not intended to replace advice given to you by your health care provider. Make sure you discuss any questions you have with your health care provider.  Document Released: 06/22/2005 Document Revised: 02/27/2016 Document Reviewed: 03/08/2015  Elsevier Interactive Patient Education © 2018 [employer]

## 2017-02-16 NOTE — Progress Notes (Signed)
Pt is here for a routine OB visit. Also NST. Is c/o pressure in vag area "like he is going to fall out".

## 2017-02-16 NOTE — Progress Notes (Signed)
ROB: Notes occasional contractions and pelvic pressure.  Also noting sciatic pain.  Discussed relief measures.  GBS neg. Declined exam today. Last ultrasound performed with [facility] 8/2 still noting LGA infant, 97%ile (3370 grams).  Has previously been discussed when induction and C-section are appropriate for macrosomia. NST performed today was reviewed and was found to be reactive.  Continue recommended antenatal testing and prenatal care.   NONSTRESS TEST INTERPRETATION  INDICATIONS: Cleft lip and Obesity  FHR baseline: 140 RESULTS:Reactive COMMENTS: Uterine irritability present   PLAN: 1. Continue fetal kick counts twice a day. 2. Continue antepartum testing as scheduled-weekly

## 2017-02-22 NOTE — Progress Notes (Signed)
Patient seen by me, agree with assessment and plan as outlined in [facility] note.  

## 2017-02-24 ENCOUNTER — Other Ambulatory Visit: Payer: Medicaid Other

## 2017-02-24 ENCOUNTER — Encounter: Payer: Self-pay | Admitting: Obstetrics and Gynecology

## 2017-02-24 ENCOUNTER — Ambulatory Visit (INDEPENDENT_AMBULATORY_CARE_PROVIDER_SITE_OTHER): Payer: Medicaid Other | Admitting: Obstetrics and Gynecology

## 2017-02-24 VITALS — BP 125/90 | HR 95 | Wt 241.4 lb

## 2017-02-24 DIAGNOSIS — O3663X0 Maternal care for excessive fetal growth, third trimester, not applicable or unspecified: Secondary | ICD-10-CM

## 2017-02-24 DIAGNOSIS — O35AXX0 Maternal care for other (suspected) fetal abnormality and damage, fetal facial anomalies, not applicable or unspecified: Secondary | ICD-10-CM

## 2017-02-24 DIAGNOSIS — O358XX0 Maternal care for other (suspected) fetal abnormality and damage, not applicable or unspecified: Secondary | ICD-10-CM

## 2017-02-24 LAB — POCT URINALYSIS DIPSTICK
BILIRUBIN UA: NEGATIVE
Blood, UA: NEGATIVE
GLUCOSE UA: NEGATIVE
KETONES UA: NEGATIVE
LEUKOCYTES UA: NEGATIVE
NITRITE UA: NEGATIVE
PH UA: 6.5 (ref 5.0–8.0)
Protein, UA: NEGATIVE
Spec Grav, UA: 1.01 (ref 1.010–1.025)
Urobilinogen, UA: 0.2 E.U./dL

## 2017-02-24 NOTE — Progress Notes (Signed)
ROB:  Macrosomia - pt with f/u at [facility] tomorrow.  Occ ctx.  S&S of labor discussed.  Pt not interested in primary CD.  Mild BP increase - NO PROTEIN - f/u Friday for BP check  and Urine. NONSTRESS TEST INTERPRETATION  INDICATIONS: Macrosomia, cleft lip FHR baseline: 140 RESULTS:  reactive COMMENTS:   PLAN: 1. Continue fetal kick counts as directed. 2. Continue antepartum testing as scheduled.  Elonda Husky, M.D. 02/24/2017 9:55 AM

## 2017-02-25 ENCOUNTER — Ambulatory Visit
Admission: RE | Admit: 2017-02-25 | Discharge: 2017-02-25 | Disposition: A | Discharge status: Home / Self Care | Payer: Managed Care, Other (non HMO) | Source: Ambulatory Visit | Attending: Maternal and Fetal Medicine | Admitting: Maternal and Fetal Medicine

## 2017-02-25 ENCOUNTER — Telehealth: Payer: Self-pay | Admitting: Obstetrics and Gynecology

## 2017-02-25 DIAGNOSIS — O358XX1 Maternal care for other (suspected) fetal abnormality and damage, fetus 1: Secondary | ICD-10-CM | POA: Insufficient documentation

## 2017-02-25 DIAGNOSIS — O35AXX1 Maternal care for other (suspected) fetal abnormality and damage, fetal facial anomalies, fetus 1: Secondary | ICD-10-CM

## 2017-02-25 DIAGNOSIS — Z3A38 38 weeks gestation of pregnancy: Secondary | ICD-10-CM | POA: Diagnosis not present

## 2017-02-25 NOTE — Telephone Encounter (Signed)
Spoke with pt- states she was at [facility] for a scan and she felt a "trickle" run down her leg. Denies regular contractions- states she has had some slight cramping right along. Denies bleeding or continual fluid leakage. Advised to contact office if contractions become stronger and more regular or has bleeding and more fluid loss.

## 2017-02-25 NOTE — Telephone Encounter (Signed)
Spoke with pt she states she is not leaking anymore- "its just a drip here and there". Advised to seek emergent care if the leaking is continual or she has contractions. She has an appt tomorrow for labs and a BP check.

## 2017-02-25 NOTE — Telephone Encounter (Signed)
The patient called and stated that she had an u/s at [facility] and was informed that she has low fluid, /and she believe that she may be leaking fluid as well. The patient wants to know if she needs to be seen or come in. The patient did not disclose any other information other than wanting to speak with someone in regards to her concerns. Please advise.

## 2017-02-26 ENCOUNTER — Other Ambulatory Visit (INDEPENDENT_AMBULATORY_CARE_PROVIDER_SITE_OTHER): Payer: Medicaid Other | Admitting: Obstetrics and Gynecology

## 2017-02-26 VITALS — BP 109/71 | HR 81

## 2017-02-26 DIAGNOSIS — Z3483 Encounter for supervision of other normal pregnancy, third trimester: Secondary | ICD-10-CM

## 2017-02-26 LAB — POCT URINALYSIS DIPSTICK
BILIRUBIN UA: NEGATIVE
Glucose, UA: NEGATIVE
Ketones, UA: NEGATIVE
LEUKOCYTES UA: NEGATIVE
NITRITE UA: NEGATIVE
PH UA: 6.5 (ref 5.0–8.0)
Protein, UA: NEGATIVE
RBC UA: NEGATIVE
Spec Grav, UA: 1.015 (ref 1.010–1.025)
UROBILINOGEN UA: 0.2 U/dL

## 2017-02-26 NOTE — Progress Notes (Unsigned)
Pt here for lab draw and to check BP and urine.

## 2017-03-03 ENCOUNTER — Ambulatory Visit (INDEPENDENT_AMBULATORY_CARE_PROVIDER_SITE_OTHER): Payer: Medicaid Other | Admitting: Obstetrics and Gynecology

## 2017-03-03 VITALS — BP 148/86 | HR 89 | Wt 243.5 lb

## 2017-03-03 DIAGNOSIS — O35AXX1 Maternal care for other (suspected) fetal abnormality and damage, fetal facial anomalies, fetus 1: Secondary | ICD-10-CM

## 2017-03-03 DIAGNOSIS — Z3483 Encounter for supervision of other normal pregnancy, third trimester: Secondary | ICD-10-CM | POA: Diagnosis not present

## 2017-03-03 DIAGNOSIS — O368130 Decreased fetal movements, third trimester, not applicable or unspecified: Secondary | ICD-10-CM

## 2017-03-03 DIAGNOSIS — Z3A39 39 weeks gestation of pregnancy: Secondary | ICD-10-CM

## 2017-03-03 DIAGNOSIS — O3663X0 Maternal care for excessive fetal growth, third trimester, not applicable or unspecified: Secondary | ICD-10-CM

## 2017-03-03 DIAGNOSIS — O358XX1 Maternal care for other (suspected) fetal abnormality and damage, fetus 1: Secondary | ICD-10-CM

## 2017-03-03 LAB — POCT URINALYSIS DIPSTICK
BILIRUBIN UA: NEGATIVE
Blood, UA: NEGATIVE
GLUCOSE UA: NEGATIVE
KETONES UA: NEGATIVE
Nitrite, UA: NEGATIVE
PH UA: 7.5 (ref 5.0–8.0)
Protein, UA: NEGATIVE
Spec Grav, UA: <=1.005 — AB (ref 1.010–1.025)
Urobilinogen, UA: 0.2 E.U./dL

## 2017-03-03 NOTE — Progress Notes (Signed)
ROB: Patient noting contractions q 10 min starting this morning.  Sharp shooting but still tolerable.  Patient also noting decreased fetal movement.  Is performing fetal kick counts.  NST performed today was reviewed and was found to be reactive. Patient also noting that she now desires elective primary C-section.  States [facility], and fetus was noted to be >10 lbs.  Does not desire to undergo labor as her last baby was only 7.5 lbs. Is afraid of having a shoulder dystocia.  Discussed elective primary C-section risks vs benefits.  Will schedule patient for Friday, 03/05/17 with Dr. Logan BoresEvans. Pre-op done today.    NONSTRESS TEST INTERPRETATION  INDICATIONS: Decreased fetal movement and Obesity  FHR baseline: 125 RESULTS:Reactive COMMENTS: Contractions q 5-10   PLAN: 1. Continue fetal kick counts twice a day. 2. Scheduled for C-section on Friday 03/05/2017   Hildred Laserherry, Shelli Portilla, MD [facility]

## 2017-03-04 ENCOUNTER — Encounter
Admission: RE | Admit: 2017-03-04 | Discharge: 2017-03-04 | Disposition: A | Discharge status: Home / Self Care | Payer: Medicaid Other | Source: Ambulatory Visit | Attending: Obstetrics and Gynecology | Admitting: Obstetrics and Gynecology

## 2017-03-04 HISTORY — DX: Anxiety disorder, unspecified: F41.9

## 2017-03-04 HISTORY — DX: Gastro-esophageal reflux disease without esophagitis: K21.9

## 2017-03-04 LAB — CBC
HEMATOCRIT: 34.7 % — AB (ref 35.0–47.0)
HEMOGLOBIN: 12.3 g/dL (ref 12.0–16.0)
MCH: 28.5 pg (ref 26.0–34.0)
MCHC: 35.3 g/dL (ref 32.0–36.0)
MCV: 80.8 fL (ref 80.0–100.0)
Platelets: 257 10*3/uL (ref 150–440)
RBC: 4.3 MIL/uL (ref 3.80–5.20)
RDW: 14.1 % (ref 11.5–14.5)
WBC: 10.3 10*3/uL (ref 3.6–11.0)

## 2017-03-04 LAB — TYPE AND SCREEN
ABO/RH(D): B POS
ANTIBODY SCREEN: NEGATIVE
EXTEND SAMPLE REASON: UNDETERMINED

## 2017-03-04 LAB — RAPID HIV SCREEN (HIV 1/2 AB+AG)
HIV 1/2 ANTIBODIES: NONREACTIVE
HIV-1 P24 ANTIGEN - HIV24: NONREACTIVE

## 2017-03-04 NOTE — Patient Instructions (Signed)
Your procedure is scheduled on: March 05, 2017 (FRIDAY ) Report to EMERGENCY DEPARTMENT ARRIVAL TIME 10:00 AM  Remember: Instructions that are not followed completely may result in serious medical risk, up to and including death, or upon the discretion of your surgeon and anesthesiologist your surgery may need to be rescheduled.    _x___ 1. Do not eat food after midnight the night before your procedure. You may drink clear liquids up to 2 hours before you are scheduled to arrive at the hospital for your procedure.  Do not drink clear liquids within 2 hours of your scheduled arrival to the hospital.  Clear liquids include  --Water or Apple juice without pulp  --Clear carbohydrate beverage such as ClearFast or Gatorade  --Black Coffee or Clear Tea (No milk, no creamers, do not add anything to                  the coffee or Tea Type 1 and type 2 diabetics should only drink water.  No gum chewing or hard candies.     __x__ 2. No Alcohol for 24 hours before or after surgery.   __x__3. No Smoking for 24 prior to surgery.   ____  4. Bring all medications with you on the day of surgery if instructed.    __x__ 5. Notify your doctor if there is any change in your medical condition     (cold, fever, infections).     Do not wear jewelry, make-up, hairpins, clips or nail polish.  Do not wear lotions, powders, or perfumes.   Do not shave 48 hours prior to surgery. Men may shave face and neck.  Do not bring valuables to the hospital.    [facility] is not responsible for any belongings or valuables.               Contacts, dentures or bridgework may not be worn into surgery.  Leave your suitcase in the car. After surgery it may be brought to your room.  For patients admitted to the hospital, discharge time is determined by your treatment team           Patients discharged the day of surgery will not be allowed to drive home.  You will need someone to drive you home and stay with you the night  of your procedure.    Please read over the following fact sheets that you were given:   [facility] Preparing for Surgery and or MRSA Information   ___ Take anti-hypertensive (unless it includes a diuretic), cardiac, seizure, asthma,     anti-reflux and psychiatric medicines. These include:  1.   2.  3.  4.  5.  6.  ____Fleets enema or Magnesium Citrate as directed.   _x___ Use CHG Soap or sage wipes as directed on instruction sheet  (AVOID CONTACT WITH BREAST )  ____ Use inhalers on the day of surgery and bring to hospital day of surgery  ____ Stop Metformin and Janumet 2 days prior to surgery.    ____ Take 1/2 of usual insulin dose the night before surgery and none on the morning surgery    _x___ Follow recommendations from Cardiologist, Pulmonologist or PCP regarding          stopping Aspirin, Coumadin, Plavix ,Eliquis, Effient, or Pradaxa, and Pletal.  X____Stop Anti-inflammatories such as Advil, Aleve, Ibuprofen, Motrin, Naproxen, Naprosyn, Goodies powders or aspirin products. OK to take Tylenol    _x___ Stop supplements until after surgery.  But  may continue Vitamin D, Vitamin B,       and multivitamin.   ____ Bring C-Pap to the hospital.

## 2017-03-05 ENCOUNTER — Inpatient Hospital Stay
Admission: RE | Admit: 2017-03-05 | Discharge: 2017-03-08 | DRG: 765 | Disposition: A | Discharge status: Home / Self Care | Payer: Medicaid Other | Source: Ambulatory Visit | Attending: Obstetrics and Gynecology | Admitting: Obstetrics and Gynecology

## 2017-03-05 ENCOUNTER — Encounter
Admission: RE | Disposition: A | Discharge status: Home / Self Care | Payer: Self-pay | Source: Ambulatory Visit | Attending: Obstetrics and Gynecology

## 2017-03-05 ENCOUNTER — Inpatient Hospital Stay: Payer: Medicaid Other | Admitting: Anesthesiology

## 2017-03-05 DIAGNOSIS — O3663X0 Maternal care for excessive fetal growth, third trimester, not applicable or unspecified: Principal | ICD-10-CM | POA: Diagnosis present

## 2017-03-05 DIAGNOSIS — K219 Gastro-esophageal reflux disease without esophagitis: Secondary | ICD-10-CM | POA: Diagnosis present

## 2017-03-05 DIAGNOSIS — O358XX0 Maternal care for other (suspected) fetal abnormality and damage, not applicable or unspecified: Secondary | ICD-10-CM | POA: Diagnosis present

## 2017-03-05 DIAGNOSIS — Z3A39 39 weeks gestation of pregnancy: Secondary | ICD-10-CM | POA: Diagnosis not present

## 2017-03-05 DIAGNOSIS — O99214 Obesity complicating childbirth: Secondary | ICD-10-CM | POA: Diagnosis present

## 2017-03-05 DIAGNOSIS — O9962 Diseases of the digestive system complicating childbirth: Secondary | ICD-10-CM | POA: Diagnosis present

## 2017-03-05 DIAGNOSIS — Z87891 Personal history of nicotine dependence: Secondary | ICD-10-CM

## 2017-03-05 DIAGNOSIS — Z6841 Body Mass Index (BMI) 40.0 and over, adult: Secondary | ICD-10-CM

## 2017-03-05 LAB — RPR: RPR: NONREACTIVE

## 2017-03-05 LAB — ABO/RH: ABO/RH(D): B POS

## 2017-03-05 SURGERY — Surgical Case
Anesthesia: Spinal | Wound class: Clean Contaminated

## 2017-03-05 MED ORDER — COCONUT OIL OIL
1.0000 "application " | TOPICAL_OIL | Status: DC | PRN
  Administered 2017-03-06: 1 via TOPICAL
  Filled 2017-03-05: qty 120

## 2017-03-05 MED ORDER — SENNOSIDES-DOCUSATE SODIUM 8.6-50 MG PO TABS
2.0000 | ORAL_TABLET | ORAL | Status: DC
  Administered 2017-03-07: 2 via ORAL
  Filled 2017-03-05 (×2): qty 2

## 2017-03-05 MED ORDER — LIDOCAINE 5 % EX PTCH
MEDICATED_PATCH | CUTANEOUS | Status: AC
  Filled 2017-03-05: qty 1

## 2017-03-05 MED ORDER — KETOROLAC TROMETHAMINE 30 MG/ML IJ SOLN
30.0000 mg | Freq: Four times a day (QID) | INTRAMUSCULAR | Status: AC | PRN
  Administered 2017-03-05 – 2017-03-06 (×3): 30 mg via INTRAVENOUS
  Filled 2017-03-05 (×3): qty 1

## 2017-03-05 MED ORDER — PHENYLEPHRINE 40 MCG/ML (10ML) SYRINGE FOR IV PUSH (FOR BLOOD PRESSURE SUPPORT)
PREFILLED_SYRINGE | INTRAVENOUS | Status: DC | PRN
  Administered 2017-03-05 (×3): 100 ug via INTRAVENOUS

## 2017-03-05 MED ORDER — FENTANYL CITRATE (PF) 100 MCG/2ML IJ SOLN: 25.0000 ug | INTRAMUSCULAR | Status: DC | PRN

## 2017-03-05 MED ORDER — SIMETHICONE 80 MG PO CHEW
80.0000 mg | CHEWABLE_TABLET | Freq: Four times a day (QID) | ORAL | Status: DC
  Administered 2017-03-06 – 2017-03-08 (×9): 80 mg via ORAL
  Filled 2017-03-05 (×10): qty 1

## 2017-03-05 MED ORDER — SOD CITRATE-CITRIC ACID 500-334 MG/5ML PO SOLN
ORAL | Status: AC
  Administered 2017-03-05: 30 mL via ORAL
  Filled 2017-03-05: qty 15

## 2017-03-05 MED ORDER — MORPHINE SULFATE (PF) 0.5 MG/ML IJ SOLN
INTRAMUSCULAR | Status: DC | PRN
  Administered 2017-03-05: .2 mg via EPIDURAL

## 2017-03-05 MED ORDER — OXYTOCIN 40 UNITS IN LACTATED RINGERS INFUSION - SIMPLE MED
INTRAVENOUS | Status: AC
  Filled 2017-03-05: qty 1000

## 2017-03-05 MED ORDER — WITCH HAZEL-GLYCERIN EX PADS: 1.0000 "application " | MEDICATED_PAD | CUTANEOUS | Status: DC | PRN

## 2017-03-05 MED ORDER — LACTATED RINGERS IV SOLN: INTRAVENOUS | Status: DC

## 2017-03-05 MED ORDER — NALBUPHINE HCL 10 MG/ML IJ SOLN: 5.0000 mg | INTRAMUSCULAR | Status: DC | PRN

## 2017-03-05 MED ORDER — IBUPROFEN 600 MG PO TABS
600.0000 mg | ORAL_TABLET | Freq: Four times a day (QID) | ORAL | Status: DC
  Administered 2017-03-06 – 2017-03-08 (×8): 600 mg via ORAL
  Filled 2017-03-05 (×8): qty 1

## 2017-03-05 MED ORDER — ACETAMINOPHEN 500 MG PO TABS
1000.0000 mg | ORAL_TABLET | Freq: Four times a day (QID) | ORAL | Status: AC
  Administered 2017-03-06: 1000 mg via ORAL
  Filled 2017-03-05: qty 2

## 2017-03-05 MED ORDER — PRENATAL MULTIVITAMIN CH
1.0000 | ORAL_TABLET | Freq: Every day | ORAL | Status: DC
  Administered 2017-03-06 – 2017-03-08 (×3): 1 via ORAL
  Filled 2017-03-05 (×3): qty 1

## 2017-03-05 MED ORDER — KETOROLAC TROMETHAMINE 30 MG/ML IJ SOLN: 30.0000 mg | Freq: Four times a day (QID) | INTRAMUSCULAR | Status: AC | PRN

## 2017-03-05 MED ORDER — ONDANSETRON HCL 4 MG/2ML IJ SOLN
4.0000 mg | Freq: Once | INTRAMUSCULAR | Status: AC | PRN
  Administered 2017-03-05: 4 mg via INTRAVENOUS
  Filled 2017-03-05: qty 2

## 2017-03-05 MED ORDER — OXYCODONE-ACETAMINOPHEN 5-325 MG PO TABS
1.0000 | ORAL_TABLET | ORAL | Status: DC | PRN
  Administered 2017-03-06 – 2017-03-08 (×4): 1 via ORAL
  Filled 2017-03-05 (×4): qty 1

## 2017-03-05 MED ORDER — ONDANSETRON HCL 4 MG/2ML IJ SOLN
INTRAMUSCULAR | Status: AC
  Filled 2017-03-05: qty 2

## 2017-03-05 MED ORDER — OXYCODONE-ACETAMINOPHEN 5-325 MG PO TABS
2.0000 | ORAL_TABLET | ORAL | Status: DC | PRN
  Filled 2017-03-05: qty 2

## 2017-03-05 MED ORDER — DIBUCAINE 1 % RE OINT: 1.0000 "application " | TOPICAL_OINTMENT | RECTAL | Status: DC | PRN

## 2017-03-05 MED ORDER — SOD CITRATE-CITRIC ACID 500-334 MG/5ML PO SOLN
30.0000 mL | Freq: Once | ORAL | Status: AC
  Administered 2017-03-05: 30 mL via ORAL

## 2017-03-05 MED ORDER — ONDANSETRON HCL 4 MG/2ML IJ SOLN: 4.0000 mg | Freq: Three times a day (TID) | INTRAMUSCULAR | Status: DC | PRN

## 2017-03-05 MED ORDER — DIPHENHYDRAMINE HCL 25 MG PO CAPS: 25.0000 mg | ORAL_CAPSULE | Freq: Four times a day (QID) | ORAL | Status: DC | PRN

## 2017-03-05 MED ORDER — SODIUM CHLORIDE 0.9% FLUSH: 3.0000 mL | INTRAVENOUS | Status: DC | PRN

## 2017-03-05 MED ORDER — MORPHINE SULFATE (PF) 0.5 MG/ML IJ SOLN
INTRAMUSCULAR | Status: AC
Start: 2017-03-05 — End: ?
  Filled 2017-03-05: qty 10

## 2017-03-05 MED ORDER — DIPHENHYDRAMINE HCL 50 MG/ML IJ SOLN
12.5000 mg | INTRAMUSCULAR | Status: DC | PRN
  Administered 2017-03-05: 12.5 mg via INTRAVENOUS
  Filled 2017-03-05: qty 1

## 2017-03-05 MED ORDER — MENTHOL 3 MG MT LOZG
1.0000 | LOZENGE | OROMUCOSAL | Status: DC | PRN
  Filled 2017-03-05: qty 9

## 2017-03-05 MED ORDER — ACETAMINOPHEN 325 MG PO TABS: 650.0000 mg | ORAL_TABLET | ORAL | Status: DC | PRN

## 2017-03-05 MED ORDER — ONDANSETRON HCL 4 MG/2ML IJ SOLN
INTRAMUSCULAR | Status: DC | PRN
Start: 2017-03-05 — End: 2017-03-05
  Administered 2017-03-05: 4 mg via INTRAVENOUS

## 2017-03-05 MED ORDER — OXYTOCIN 40 UNITS IN LACTATED RINGERS INFUSION - SIMPLE MED
2.5000 [IU]/h | INTRAVENOUS | Status: AC
  Administered 2017-03-05: 2.5 [IU]/h via INTRAVENOUS
  Filled 2017-03-05: qty 1000

## 2017-03-05 MED ORDER — PHENYLEPHRINE HCL 10 MG/ML IJ SOLN
INTRAMUSCULAR | Status: DC | PRN
  Administered 2017-03-05: 50 ug/min via INTRAVENOUS

## 2017-03-05 MED ORDER — LACTATED RINGERS IV SOLN
INTRAVENOUS | Status: DC
  Administered 2017-03-05: 12:00:00 via INTRAVENOUS

## 2017-03-05 MED ORDER — NALOXONE HCL 0.4 MG/ML IJ SOLN: 0.4000 mg | INTRAMUSCULAR | Status: DC | PRN

## 2017-03-05 MED ORDER — CEFAZOLIN SODIUM-DEXTROSE 2-4 GM/100ML-% IV SOLN
2.0000 g | INTRAVENOUS | Status: AC
  Administered 2017-03-05: 2 g via INTRAVENOUS
  Filled 2017-03-05: qty 100

## 2017-03-05 MED ORDER — LACTATED RINGERS IV SOLN
Freq: Once | INTRAVENOUS | Status: AC
  Administered 2017-03-05: 11:00:00 via INTRAVENOUS

## 2017-03-05 MED ORDER — ZOLPIDEM TARTRATE 5 MG PO TABS: 5.0000 mg | ORAL_TABLET | Freq: Every evening | ORAL | Status: DC | PRN

## 2017-03-05 MED ORDER — OXYTOCIN 40 UNITS IN LACTATED RINGERS INFUSION - SIMPLE MED
INTRAVENOUS | Status: DC | PRN
  Administered 2017-03-05: 300 mL via INTRAVENOUS

## 2017-03-05 MED ORDER — BUPIVACAINE IN DEXTROSE 0.75-8.25 % IT SOLN
INTRATHECAL | Status: DC | PRN
  Administered 2017-03-05: 1.8 mL via INTRATHECAL

## 2017-03-05 MED ORDER — DIPHENHYDRAMINE HCL 25 MG PO CAPS: 25.0000 mg | ORAL_CAPSULE | ORAL | Status: DC | PRN

## 2017-03-05 MED ORDER — LIDOCAINE 5 % EX PTCH
MEDICATED_PATCH | CUTANEOUS | Status: DC | PRN
  Administered 2017-03-05: 1 via TRANSDERMAL

## 2017-03-05 MED ORDER — MEPERIDINE HCL 25 MG/ML IJ SOLN: 6.2500 mg | INTRAMUSCULAR | Status: DC | PRN

## 2017-03-05 SURGICAL SUPPLY — 35 items
ADH LQ OCL WTPRF AMP STRL LF (MISCELLANEOUS) ×1
ADHESIVE MASTISOL STRL (MISCELLANEOUS) ×2 IMPLANT
BAG COUNTER SPONGE EZ (MISCELLANEOUS) ×2 IMPLANT
BAG SPNG 4X4 CLR HAZ (MISCELLANEOUS) ×1
CANISTER SUCT 3000ML PPV (MISCELLANEOUS) ×2 IMPLANT
CELL SAVER LIPIGURD (MISCELLANEOUS) ×1 IMPLANT
CHLORAPREP W/TINT 26ML (MISCELLANEOUS) ×4 IMPLANT
COVER LIGHT HANDLE STERIS (MISCELLANEOUS) ×1 IMPLANT
DEVICE RETRIEVAL ALEXIS 14 (MISCELLANEOUS) ×1 IMPLANT
DRSG TEGADERM 4X4.75 (GAUZE/BANDAGES/DRESSINGS) ×1 IMPLANT
DRSG TELFA 3X8 NADH (GAUZE/BANDAGES/DRESSINGS) ×4 IMPLANT
EXTRT SYSTEM ALEXIS 14CM (MISCELLANEOUS) ×2
GAUZE SPONGE 4X4 12PLY STRL (GAUZE/BANDAGES/DRESSINGS) ×3 IMPLANT
GLOVE INDICATOR 7.0 STRL GRN (GLOVE) ×2 IMPLANT
GLOVE ORTHO TXT STRL SZ7.5 (GLOVE) ×2 IMPLANT
GLOVE PROTEXIS LATEX SZ 7.5 (GLOVE) ×2 IMPLANT
GLOVE SURG LATEX 7.5 PF (GLOVE) ×1 IMPLANT
GOWN STRL REUS W/ TWL LRG LVL3 (GOWN DISPOSABLE) ×2 IMPLANT
GOWN STRL REUS W/TWL LRG LVL3 (GOWN DISPOSABLE) ×4
KIT RM TURNOVER STRD PROC AR (KITS) ×2 IMPLANT
NS IRRIG 1000ML POUR BTL (IV SOLUTION) ×2 IMPLANT
PACK C SECTION AR (MISCELLANEOUS) ×2 IMPLANT
PAD DRESSING TELFA 3X8 NADH (GAUZE/BANDAGES/DRESSINGS) ×1 IMPLANT
PAD OB MATERNITY 4.3X12.25 (PERSONAL CARE ITEMS) ×2 IMPLANT
PAD PREP 24X41 OB/GYN DISP (PERSONAL CARE ITEMS) ×2 IMPLANT
RTRCTR C-SECT PINK 25CM LRG (MISCELLANEOUS) ×1 IMPLANT
SPONGE LAP 18X18 5 PK (GAUZE/BANDAGES/DRESSINGS) ×2 IMPLANT
STRIP CLOSURE SKIN 1/2X4 (GAUZE/BANDAGES/DRESSINGS) ×1 IMPLANT
SUCT VACUUM KIWI BELL (SUCTIONS) ×1 IMPLANT
SUT VIC AB 0 CTX 36 (SUTURE) ×4
SUT VIC AB 0 CTX36XBRD ANBCTRL (SUTURE) IMPLANT
SUT VIC AB 1 CT1 36 (SUTURE) ×3 IMPLANT
SUT VICRYL 3-0 (SUTURE) ×1 IMPLANT
SUT VICRYL 3-0 36IN CTB-1 (SUTURE) ×1 IMPLANT
SUT VICRYL+ 3-0 36IN CT-1 (SUTURE) ×2 IMPLANT

## 2017-03-05 NOTE — Anesthesia Preprocedure Evaluation (Signed)
Anesthesia Evaluation  Patient identified by MRN, date of birth, ID band Patient awake    Reviewed: Allergy & Precautions, NPO status , Patient's Chart, lab work & pertinent test results, reviewed documented beta blocker date and time   Airway Mallampati: III  TM Distance: >3 FB     Dental  (+) Chipped   Pulmonary former smoker,           Cardiovascular      Neuro/Psych  Headaches, PSYCHIATRIC DISORDERS Anxiety Depression    GI/Hepatic GERD  Controlled,  Endo/Other  Morbid obesity  Renal/GU      Musculoskeletal   Abdominal   Peds  Hematology   Anesthesia Other Findings BPs in the 148/96 range.  Reproductive/Obstetrics                             Anesthesia Physical Anesthesia Plan  ASA: III  Anesthesia Plan: Spinal   Post-op Pain Management:    Induction:   PONV Risk Score and Plan:   Airway Management Planned:   Additional Equipment:   Intra-op Plan:   Post-operative Plan:   Informed Consent: I have reviewed the patients History and Physical, chart, labs and discussed the procedure including the risks, benefits and alternatives for the proposed anesthesia with the patient or authorized representative who has indicated his/her understanding and acceptance.     Plan Discussed with: CRNA  Anesthesia Plan Comments:         Anesthesia Quick Evaluation

## 2017-03-05 NOTE — Transfer of Care (Signed)
Immediate Anesthesia Transfer of Care Note  Patient: Alicia Hendricks  Procedure(s) Performed: Procedure(s): PRIMARY CESAREAN SECTION (N/A)  Patient Location: PACU  Anesthesia Type:Spinal  Level of Consciousness: awake, alert  and oriented  Airway & Oxygen Therapy: Patient Spontanous Breathing  Post-op Assessment: Report given to RN and Post -op Vital signs reviewed and stable  Post vital signs: Reviewed and stable  Last Vitals:  Vitals:   03/05/17 1007 03/05/17 1024  BP: (!) 140/96 129/74  Pulse: (!) 105 98  Resp: 18   Temp: 36.9 C     Last Pain:  Vitals:   03/05/17 1007  TempSrc: Oral  PainSc: 0-No pain         Complications: No apparent anesthesia complications

## 2017-03-05 NOTE — Anesthesia Post-op Follow-up Note (Signed)
Anesthesia QCDR form completed.        

## 2017-03-05 NOTE — Progress Notes (Signed)
Ambulated to OR for scheduled c/s

## 2017-03-05 NOTE — Op Note (Signed)
       OP NOTE  Date @EDTODAYDATE @ @TIMESTAMP @ Name Alicia Hendricks MR# [id number]  Preoperative Diagnosis: 1. Intrauterine pregnancy at 7176w2d Active Problems:   * No active hospital problems. *  2.  Suspected Macrosomia - parental election  Postoperative Diagnosis: 1. Intrauterine pregnancy at 4476w2d, delivered 2. Viable infant 3. Remainder same as pre-op 4.  Macrosomic infant  Procedure: 1. Primary Low-Transverse Cesarean Section  Surgeon: Elonda Huskyavid J. Morgan Keinath, MD  Anesthesia: spinal  EBL: 900 mL  Findings: 1. Female infant in the cephalic presentation 2. Normal uterus, tubes and ovaries.    Procedure:  The patient was prepped and draped in the supine position and placed under spinal anesthesia.  A transverse incision was made across the abdomen in a Pfannenstiel manner. If indicated the old scar was systematically removed with sharp dissection.  We carried the dissection down to the level of the fascia.  The fascia was incised in a curvilinear manner.  The fascia was then elevated from the rectus muscles with blunt and sharp dissection.  The rectus muscles were separated laterally exposing the peritoneum.  The peritoneum was carefully entered with care being taken to avoid bowel and bladder.  A self-retaining retractor was placed.  The visceral peritoneum was incised in a curvilinear fashion across the lower uterine segment creating a bladder flap. A transverse incision was made across the lower uterine segment and extended laterally and superiorly using the bandage scissors.  Artificial rupture membranes was performed and a large amount of clear fluid was noted.  The infant was delivered from the cephalic position.  A vacuum was employed to assist in delivery of the fetal vertex.  Infant size and maternal body habitus would not allow well applied external expulsive force.  The cord was doubly clamped and cut. Cord blood was obtained.  The infant was handed to the pediatrician who  then placed the infant under heat lamps where it was cleaned dried and re-suctioned. The placenta was delivered. The hysterotomy incision was then identified on ring forceps.  The uterine cavity was cleaned with a moist lap sponge.  The hysterotomy incision was closed with a running interlocking suture of Vicryl.  Hemostasis was excellent.  Pitocin was run in the IV and the uterus was found to be firm. The posterior cul-de-sac and gutters were cleaned and inspected.  Hemostasis was noted.  The fascia was then closed with a running suture of #1 Vicryl.  Hemostasis of the subcutaneous tissues was obtained using the Bovie.  The subcutaneous tissues were closed with a running suture of 000 Vicryl.  A subcuticular suture was placed.  Steri-Strips were applied in the usual manner.  A pressure dressing was placed.  The patient went to the recovery room in stable condition.   Elonda Huskyavid J. Somalia Segler, M.D. 03/05/2017 1:26 PM

## 2017-03-05 NOTE — Anesthesia Procedure Notes (Signed)
Date/Time: 03/05/2017 12:38 PM Performed by: Junious SilkNOLES, Alicia Hibberd Pre-anesthesia Checklist: Patient identified, Emergency Drugs available, Suction available, Patient being monitored and Timeout performed Oxygen Delivery Method: Nasal cannula

## 2017-03-05 NOTE — H&P (Signed)
History and Physical   HPI  Alicia Hendricks is a 26 y.o. G2P1001 at 54110w2d Estimated Date of Delivery: 03/10/17 who is being admitted for primary cesarean delivery for suspected macrosomia.  Pt and partner have expressed strong interest in elective cesarean.    OB History  Obstetric History   G2   P1   T1   P0   A0   L1    SAB0   TAB0   Ectopic0   Multiple0   Live Births1     # Outcome Date GA Lbr Len/2nd Weight Sex Delivery Anes PTL Lv  2 Current           1 Term 06/01/06 7126w0d  7 lb 8 oz (3.402 kg) M Vag-Spont  N LIV      PROBLEM LIST  Pregnancy complications or risks: Patient Active Problem List   Diagnosis Date Noted  . Excessive fetal growth affecting management of mother in third trimester, antepartum 02/04/2017  . Gastroesophageal reflux in pregnancy in third trimester 01/19/2017  . Family history of cleft lip and palate   . Fetal cleft lip affecting antepartum care of mother, fetus 1   . History of abnormal cervical Pap smear 08/19/2016  . Supervision of normal intrauterine pregnancy in multigravida in second trimester 08/19/2016  . Obesity (BMI 35.0-39.9 without comorbidity) 08/19/2016  . Amenorrhea 07/24/2016    Prenatal labs and studies: ABO, Rh: --/--/B POS (08/31 1033) Antibody: NEG (08/30 1445) Rubella: 1.91 (01/19 1532) RPR: Non Reactive (08/30 1445)  HBsAg: Negative (01/19 1532)  HIV:   Neg RUE:[id number]:Negative (07/31 1022)   Past Medical History:  Diagnosis Date  . Anxiety   . Depression   . GERD (gastroesophageal reflux disease)   . History of abnormal cervical Pap smear 08/19/2016  . Migraines    migraines     Past Surgical History:  Procedure Laterality Date  . CHOLECYSTECTOMY       Medications    Current Discharge Medication List    CONTINUE these medications which have NOT CHANGED   Details  acetaminophen (TYLENOL) 500 MG tablet Take 1,000 mg by mouth every 8 (eight) hours as needed for mild pain or headache.    calcium  carbonate (TUMS - DOSED IN MG ELEMENTAL CALCIUM) 500 MG chewable tablet Chew 2 tablets by mouth daily as needed for indigestion or heartburn.    Prenatal Multivit-Min-Fe-FA (PRENATAL VITAMINS) 0.8 MG tablet Take 1 tablet by mouth daily.         Allergies  Bee venom  Review of Systems  Pertinent items are noted in HPI.  Physical Exam  BP (!) 140/96 (BP Location: Left Arm)   Pulse (!) 105   Temp 98.4 F (36.9 C) (Oral)   Resp 18   Ht 5\' 3"  (1.6 m)   Wt 246 lb (111.6 kg)   LMP 05/26/2016 (Approximate)   BMI 43.58 kg/m   Lungs:  CTA B Cardio: RRR without M/R/G Abd: Soft, gravid, NT Presentation: cephalic EXT: No C/C/ 1+ Edema DTRs: 2+ B CERVIX: not evaluated:: : :   See Prenatal records for more detailed PE.      Test Results  Results for orders placed or performed during the hospital encounter of 03/05/17 (from the past 24 hour(s))  ABO/Rh     Status: None   Collection Time: 03/05/17 10:33 AM  Result Value Ref Range   ABO/RH(D) B POS      Assessment   G2P1001 at 63110w2d Estimated Date of  Delivery: 03/10/17  The fetus is reassuring. [facility] U/S reveals fetal weight at 99 percentile.  Patient Active Problem List   Diagnosis Date Noted  . Excessive fetal growth affecting management of mother in third trimester, antepartum 02/04/2017  . Gastroesophageal reflux in pregnancy in third trimester 01/19/2017  . Family history of cleft lip and palate   . Fetal cleft lip affecting antepartum care of mother, fetus 1   . History of abnormal cervical Pap smear 08/19/2016  . Supervision of normal intrauterine pregnancy in multigravida in second trimester 08/19/2016  . Obesity (BMI 35.0-39.9 without comorbidity) 08/19/2016  . Amenorrhea 07/24/2016    Plan  1. Admit to L&D for plan Cesarean delivery 2. EFM: -- Category 1 3.  Primary cesarean delivery   Elonda Husky, M.D. 03/05/2017 12:11 PM

## 2017-03-05 NOTE — Anesthesia Procedure Notes (Signed)
Spinal  Patient location during procedure: OR Staffing Anesthesiologist: Kamill Fulbright Performed: anesthesiologist  Preanesthetic Checklist Completed: patient identified, site marked, surgical consent, pre-op evaluation, timeout performed, IV checked and risks and benefits discussed Spinal Block Patient position: sitting Prep: Betadine Patient monitoring: heart rate, cardiac monitor, continuous pulse ox and blood pressure Approach: midline Location: L3-4 Injection technique: single-shot Needle Needle type: Pencil-Tip  Needle gauge: 25 G Needle length: 9 cm Assessment Sensory level: T10     

## 2017-03-05 NOTE — Lactation Note (Signed)
This note was copied from a baby's chart. Lactation Consultation Note  Patient Name: Alicia Hendricks Today's Date: 03/05/2017     Maternal Data  Mom pumped breasts x 20 min, no breast milk obtained, drop noted on surface of both breasts, pt instructed to pump breasts every 3 hrs to promote milk production and obtain milk for baby  Feeding Feeding Type: Bottle Fed - Formula Nipple Type:  (Pigeon) Length of feed: 25 min  LATCH Score                   Interventions    Lactation Tools Discussed/Used     Consult Status      Alicia Hendricks 03/05/2017, 8:51 PM

## 2017-03-06 LAB — CBC
HEMATOCRIT: 30.1 % — AB (ref 35.0–47.0)
HEMOGLOBIN: 10.5 g/dL — AB (ref 12.0–16.0)
MCH: 28.3 pg (ref 26.0–34.0)
MCHC: 35 g/dL (ref 32.0–36.0)
MCV: 80.9 fL (ref 80.0–100.0)
Platelets: 231 10*3/uL (ref 150–440)
RBC: 3.72 MIL/uL — AB (ref 3.80–5.20)
RDW: 14 % (ref 11.5–14.5)
WBC: 12 10*3/uL — AB (ref 3.6–11.0)

## 2017-03-06 NOTE — Progress Notes (Signed)
Patient ID: Alicia Hendricks, female   DOB: 09-20-1990, 26 y.o.   MRN: [id number]     Progress Note - Cesarean Delivery  Alicia Hendricks is a 26 y.o. G2P1001 now PP day 1 s/p C-Section, Low Transverse .   Subjective:  Patient reports no problems with eating, bowel movements, voiding, or their wound Pumping  Objective:  Vital signs in last 24 hours: Temp:  [97.8 F (36.6 C)-98.4 F (36.9 C)] 98.1 F (36.7 C) (09/01 0747) Pulse Rate:  [82-105] 88 (09/01 0747) Resp:  [14-29] 18 (09/01 0747) BP: (103-140)/(58-96) 108/69 (09/01 0747) SpO2:  [96 %-99 %] 98 % (09/01 0747) Weight:  [246 lb (111.6 kg)] 246 lb (111.6 kg) (08/31 1007)  Physical Exam:  General: alert and cooperative Lochia: appropriate Uterine Fundus: firm Incision: dressing intact DVT Evaluation: No evidence of DVT seen on physical exam.    Data Review  Recent Labs  03/04/17 1445 03/06/17 0613  HGB 12.3 10.5*  HCT 34.7* 30.1*    Assessment:  Active Problems:   Delivery by cesarean section using transverse incision of lower segment of uterus   Status post Cesarean section. Doing well postoperatively.     Plan:       Continue current care.    Elonda Huskyavid J. Adlean Hardeman, M.D. 03/06/2017 9:57 AM

## 2017-03-06 NOTE — Plan of Care (Signed)
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 Mom is Pumping.  Problem: Pain Management: Goal: General experience of comfort will improve and pain level will decrease Outcome: Progressing Taking Scheduled Ibuprofen and PRN Percocet for Pain.

## 2017-03-07 MED ORDER — OXYCODONE-ACETAMINOPHEN 5-325 MG PO TABS: 1.0000 | ORAL_TABLET | ORAL | 0 refills | Status: DC | PRN

## 2017-03-07 NOTE — Progress Notes (Signed)
Patient's mom requested notice for missed day of work. RN asked patient for consent, as her name will be on her mom's work note. Patient consents to her information on her mom's work note. Patient's mom given work note.

## 2017-03-07 NOTE — Discharge Summary (Signed)
Physician Obstetric Discharge Summary  Patient ID: Alicia Hendricks MRN: [id number] DOB/AGE: 07-Jan-1991 26 y.o.   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  She is ambulating well. She is taking PO well. Pain is well controlled with current medications. Patient is urinating without difficulty.   She is passing flatus.    Objective  Vital signs in last 24 hours: BP 122/65 (BP Location: Left Arm)   Pulse 89   Temp (!) 97.4 F (36.3 C) (Oral)   Resp 17   Ht 5\' 3"  (1.6 m)   Wt 246 lb (111.6 kg)   LMP 05/26/2016 (Approximate)   SpO2 97%   BMI 43.58 kg/m   Physical Exam: Gen: NAD Abdomen:  clean, dry Fundus Fundal Tone: Firm  Lochia Amount: Small     Data Review Labs: CBC Latest Ref Rng & Units 03/06/2017 03/04/2017 12/18/2016  WBC 3.6 - 11.0 K/uL 12.0(H) 10.3 12.0(H)  Hemoglobin 12.0 - 16.0 g/dL 10.5(L) 12.3 11.5  Hematocrit 35.0 - 47.0 % 30.1(L) 34.7(L) 34.6  Platelets 150 - 440 K/uL 231 257 324   B POS  Assessment:  Active Problems:   Delivery by cesarean section using transverse incision of lower segment of uterus   Doing well.  Normal progress as expected.    Plan:  Discharge to home  Modified rest as directed - may slowly resume normal activities with restrictions  as discussed.  Medications as written.  See below for additional.       Discharge Instructions: Per After Visit Summary. Activity: Advance as tolerated. Pelvic rest for 6 weeks.  Also refer to After Visit Summary.  Wound care discussed. Diet: Regular Medications: Allergies as of 03/07/2017      Reactions   Bee Venom Anaphylaxis        Medication List    TAKE these medications   acetaminophen 500 MG tablet Commonly known as:  TYLENOL Take 1,000 mg by mouth every 8 (eight) hours as needed for mild pain or headache.   calcium carbonate 500 MG chewable tablet Commonly known as:  TUMS - dosed in mg elemental calcium Chew 2 tablets by mouth daily as needed for indigestion or heartburn.   oxyCODONE-acetaminophen 5-325 MG tablet Commonly known as:  PERCOCET/ROXICET Take 1 tablet by mouth every 4 (four) hours as needed (pain scale 4-7).   Prenatal Vitamins 0.8 MG tablet Take 1 tablet by mouth daily.            Discharge Care Instructions        Start     Ordered   03/07/17 0000  oxyCODONE-acetaminophen (PERCOCET/ROXICET) 5-325 MG tablet  Every 4 hours PRN     03/07/17 1046     Outpatient follow up:  Postpartum contraception: review at office visit  Discharged Condition: good  Discharged to: home  Newborn Data: Disposition:home with mother  Apgars: APGAR (1 MIN): 9   APGAR (5 MINS): 9   APGAR (10 MINS):    Baby Feeding: Bottle  Elonda Huskyavid J. Camil Hausmann, M.D. 03/07/2017 10:47 AM

## 2017-03-07 NOTE — Plan of Care (Signed)
Problem: Nutritional: Goal: Mothers verbalization of comfort with breastfeeding process will improve Outcome: Progressing Mother is Pumping and feeding Infant via Pigeon Nipple due to Cleft Lip and Palate.  Problem: Pain Management: Goal: General experience of comfort will improve and pain level will decrease Outcome: Progressing States Pain Relief with PRN Percocet and Scheduled Ibuprofen  Problem: Bowel/Gastric: Goal: Gastrointestinal status will improve Outcome: Completed/Met Date Met: 03/07/17 Has had Several Bowel  Movements since Surgery.  Problem: Skin Integrity: Goal: Demonstration of wound healing without infection will improve Outcome: Progressing Well approximated and without s/s complications.

## 2017-03-07 NOTE — Anesthesia Post-op Follow-up Note (Signed)
  Anesthesia Pain Follow-up Note  Patient: Alicia Hendricks  Day #: 2  Date of Follow-up: 03/07/2017 Time: 9:56 AM  Last Vitals:  Vitals:   03/07/17 0341 03/07/17 0837  BP: (!) 103/51 122/65  Pulse: 86 89  Resp: 18 17  Temp: 36.4 C (!) 36.3 C  SpO2: 97% 97%    Level of Consciousness: alert  Pain: mild   Side Effects:None  Catheter Site Exam:clean, dry, no drainage     Plan: D/C from anesthesia care at surgeon's request  Sharonlee Nine

## 2017-03-07 NOTE — Anesthesia Postprocedure Evaluation (Signed)
Anesthesia Post Note  Patient: Alicia Hendricks  Procedure(s) Performed: Procedure(s) (LRB): PRIMARY CESAREAN SECTION (N/A)  Patient location during evaluation: Mother Baby Anesthesia Type: Spinal Level of consciousness: awake and alert and oriented Pain management: pain level controlled Vital Signs Assessment: post-procedure vital signs reviewed and stable Respiratory status: spontaneous breathing Cardiovascular status: blood pressure returned to baseline Anesthetic complications: no     Last Vitals:  Vitals:   03/07/17 0341 03/07/17 0837  BP: (!) 103/51 122/65  Pulse: 86 89  Resp: 18 17  Temp: 36.4 C (!) 36.3 C  SpO2: 97% 97%    Last Pain:  Vitals:   03/07/17 0837  TempSrc: Oral  PainSc:                  Rahmon Heigl

## 2017-03-08 NOTE — Progress Notes (Addendum)
Pt discharged with infant.  Discharge instructions and follow up appointment given to and reviewed with pt.  Escorted out by staff. 

## 2017-03-12 ENCOUNTER — Encounter: Payer: Self-pay | Admitting: Obstetrics and Gynecology

## 2017-03-17 ENCOUNTER — Encounter: Payer: Self-pay | Admitting: Obstetrics and Gynecology

## 2017-03-17 ENCOUNTER — Ambulatory Visit (INDEPENDENT_AMBULATORY_CARE_PROVIDER_SITE_OTHER): Payer: Medicaid Other | Admitting: Obstetrics and Gynecology

## 2017-03-17 VITALS — BP 138/85 | HR 76 | Ht 63.0 in | Wt 216.0 lb

## 2017-03-17 DIAGNOSIS — Z9889 Other specified postprocedural states: Secondary | ICD-10-CM

## 2017-03-17 NOTE — Progress Notes (Signed)
HPI:      [name] is a 26 y.o. G2P1001 who LMP was No LMP recorded.  Subjective:   She presents today Approximately 1 week from cesarean delivery. She reports no problems. She states that she is not having pain and took very few pain pills after the first day. She reports normal bowel movements and urination. No problems eating. Her baby was born with known cleft lip and palate and has recently undergone some initial repair doing well.    Hx: The following portions of the patient's history were reviewed and updated as appropriate:             She  has a past medical history of Anxiety; Depression; GERD (gastroesophageal reflux disease); History of abnormal cervical Pap smear (08/19/2016); and Migraines. She  does not have any pertinent problems on file. She  has a past surgical history that includes Cholecystectomy and Cesarean section (N/A, 03/05/2017). Her family history includes Asthma in her brother; Diabetes in her maternal grandmother; Heart failure in her maternal grandmother; Hyperlipidemia in her mother; Stroke in her maternal grandmother; Thyroid disease in her other. She  reports that she quit smoking about 8 months ago. Her smoking use included Cigarettes. She smoked 1.00 pack per day. She has never used smokeless tobacco. She reports that she does not drink alcohol or use drugs. She is allergic to bee venom.       Review of Systems:  Review of Systems  Constitutional: Denied constitutional symptoms, night sweats, recent illness, fatigue, fever, insomnia and weight loss.  Eyes: Denied eye symptoms, eye pain, photophobia, vision change and visual disturbance.  Ears/Nose/Throat/Neck: Denied ear, nose, throat or neck symptoms, hearing loss, nasal discharge, sinus congestion and sore throat.  Cardiovascular: Denied cardiovascular symptoms, arrhythmia, chest pain/pressure, edema, exercise intolerance, orthopnea and palpitations.  Respiratory: Denied pulmonary symptoms,  asthma, pleuritic pain, productive sputum, cough, dyspnea and wheezing.  Gastrointestinal: Denied, gastro-esophageal reflux, melena, nausea and vomiting.  Genitourinary: Denied genitourinary symptoms including symptomatic vaginal discharge, pelvic relaxation issues, and urinary complaints.  Musculoskeletal: Denied musculoskeletal symptoms, stiffness, swelling, muscle weakness and myalgia.  Dermatologic: Denied dermatology symptoms, rash and scar.  Neurologic: Denied neurology symptoms, dizziness, headache, neck pain and syncope.  Psychiatric: Denied psychiatric symptoms, anxiety and depression.  Endocrine: Denied endocrine symptoms including hot flashes and night sweats.   Meds:   Current Outpatient Prescriptions on File Prior to Visit  Medication Sig Dispense Refill  . acetaminophen (TYLENOL) 500 MG tablet Take 1,000 mg by mouth every 8 (eight) hours as needed for mild pain or headache.    . oxyCODONE-acetaminophen (PERCOCET/ROXICET) 5-325 MG tablet Take 1 tablet by mouth every 4 (four) hours as needed (pain scale 4-7). 30 tablet 0  . Prenatal Multivit-Min-Fe-FA (PRENATAL VITAMINS) 0.8 MG tablet Take 1 tablet by mouth daily.    . calcium carbonate (TUMS - DOSED IN MG ELEMENTAL CALCIUM) 500 MG chewable tablet Chew 2 tablets by mouth daily as needed for indigestion or heartburn.     No current facility-administered medications on file prior to visit.     Objective:     Vitals:   03/17/17 1418  BP: 138/85  Pulse: 76               Abdomen: Soft.  Non-tender.  No masses.  No HSM.  Incision/s: Intact.  Healing well.  No erythema.  No drainage.      Assessment:    G2P1001 Patient Active Problem List   Diagnosis Date Noted  .  Delivery by cesarean section using transverse incision of lower segment of uterus 03/05/2017  . Excessive fetal growth affecting management of mother in third trimester, antepartum 02/04/2017  . Gastroesophageal reflux in pregnancy in third trimester  01/19/2017  . Family history of cleft lip and palate   . Fetal cleft lip affecting antepartum care of mother, fetus 1   . History of abnormal cervical Pap smear 08/19/2016  . Supervision of normal intrauterine pregnancy in multigravida in second trimester 08/19/2016  . Obesity (BMI 35.0-39.9 without comorbidity) 08/19/2016  . Amenorrhea 07/24/2016     1. Post-operative state     No issues.   Plan:            1.  Wound care discussed.   Orders No orders of the defined types were placed in this encounter.   No orders of the defined types were placed in this encounter.     F/U  Return in about 4 weeks (around 04/14/2017).  Elonda Huskyavid J. Keean Wilmeth, M.D. 03/17/2017 2:45 PM

## 2017-03-19 ENCOUNTER — Encounter: Payer: Self-pay | Admitting: Obstetrics and Gynecology

## 2017-03-29 ENCOUNTER — Encounter: Payer: Self-pay | Admitting: Obstetrics and Gynecology

## 2017-04-14 ENCOUNTER — Encounter: Payer: Self-pay | Admitting: Obstetrics and Gynecology

## 2017-04-14 ENCOUNTER — Ambulatory Visit (INDEPENDENT_AMBULATORY_CARE_PROVIDER_SITE_OTHER): Payer: Medicaid Other | Admitting: Obstetrics and Gynecology

## 2017-04-14 DIAGNOSIS — Z9889 Other specified postprocedural states: Secondary | ICD-10-CM

## 2017-04-14 DIAGNOSIS — Z23 Encounter for immunization: Secondary | ICD-10-CM | POA: Diagnosis not present

## 2017-04-14 DIAGNOSIS — Z3009 Encounter for other general counseling and advice on contraception: Secondary | ICD-10-CM

## 2017-04-14 NOTE — Addendum Note (Signed)
Addended by: Brooke Dare on: 04/14/2017 09:55 AM   Modules accepted: Orders

## 2017-04-14 NOTE — Progress Notes (Signed)
HPI:      [name] is a 26 y.o. G2P1001 who LMP was No LMP recorded.  Subjective:   She presents today Approximate 6 weeks' postpartum. She reports no problems. She is pumping and bottle feeding the baby.  The baby is seeing a cleft lip and palate team and is in the middle of treatment.  The patient has not resumed intercourse.  She is considering different methods of birth control but does not to affect breast-feeding.    Hx: The following portions of the patient's history were reviewed and updated as appropriate:             She  has a past medical history of Anxiety; Depression; GERD (gastroesophageal reflux disease); History of abnormal cervical Pap smear (08/19/2016); and Migraines. She  does not have any pertinent problems on file. She  has a past surgical history that includes Cholecystectomy and Cesarean section (N/A, 03/05/2017). Her family history includes Asthma in her brother; Diabetes in her maternal grandmother; Heart failure in her maternal grandmother; Hyperlipidemia in her mother; Stroke in her maternal grandmother; Thyroid disease in her other. She  reports that she quit smoking about 9 months ago. Her smoking use included Cigarettes. She smoked 1.00 pack per day. She has never used smokeless tobacco. She reports that she does not drink alcohol or use drugs. She is allergic to bee venom.       Review of Systems:  Review of Systems  Constitutional: Denied constitutional symptoms, night sweats, recent illness, fatigue, fever, insomnia and weight loss.  Eyes: Denied eye symptoms, eye pain, photophobia, vision change and visual disturbance.  Ears/Nose/Throat/Neck: Denied ear, nose, throat or neck symptoms, hearing loss, nasal discharge, sinus congestion and sore throat.  Cardiovascular: Denied cardiovascular symptoms, arrhythmia, chest pain/pressure, edema, exercise intolerance, orthopnea and palpitations.  Respiratory: Denied pulmonary symptoms, asthma, pleuritic  pain, productive sputum, cough, dyspnea and wheezing.  Gastrointestinal: Denied, gastro-esophageal reflux, melena, nausea and vomiting.  Genitourinary: Denied genitourinary symptoms including symptomatic vaginal discharge, pelvic relaxation issues, and urinary complaints.  Musculoskeletal: Denied musculoskeletal symptoms, stiffness, swelling, muscle weakness and myalgia.  Dermatologic: Denied dermatology symptoms, rash and scar.  Neurologic: Denied neurology symptoms, dizziness, headache, neck pain and syncope.  Psychiatric: Denied psychiatric symptoms, anxiety and depression.  Endocrine: Denied endocrine symptoms including hot flashes and night sweats.   Meds:   Current Outpatient Prescriptions on File Prior to Visit  Medication Sig Dispense Refill  . acetaminophen (TYLENOL) 500 MG tablet Take 1,000 mg by mouth every 8 (eight) hours as needed for mild pain or headache.    . calcium carbonate (TUMS - DOSED IN MG ELEMENTAL CALCIUM) 500 MG chewable tablet Chew 2 tablets by mouth daily as needed for indigestion or heartburn.    . Prenatal Multivit-Min-Fe-FA (PRENATAL VITAMINS) 0.8 MG tablet Take 1 tablet by mouth daily.     No current facility-administered medications on file prior to visit.     Objective:     Vitals:   04/14/17 0834  BP: 108/73  Pulse: 82               Abdomen: Soft.  Non-tender.  No masses.  No HSM.  Incision/s: Intact.  Healing well.  No erythema.  No drainage.  Small amount of cutaneous mobility and noted along the right edge of the incision.    Pelvic examination   Pelvic:   Vulva: Normal appearance.  No lesions.  No abnormal scarring.    Vagina: No lesions or abnormalities noted.  Support: Normal pelvic support.  Urethra No masses tenderness or scarring.  Meatus Normal size without lesions or prolapse.  Cervix: Normal ectropion.  No lesions.  Anus: Normal exam.  No lesions.  Perineum: Normal exam.  No lesions.  Healed well.          Bimanual   Uterus:  Normal size.  Non-tender.  Mobile.  AV.  Adnexae: No masses.  Non-tender to palpation.  Cul-de-sac: Negative for abnormality.      Assessment:    G2P1001 Patient Active Problem List   Diagnosis Date Noted  . Delivery by cesarean section using transverse incision of lower segment of uterus 03/05/2017  . Excessive fetal growth affecting management of mother in third trimester, antepartum 02/04/2017  . Gastroesophageal reflux in pregnancy in third trimester 01/19/2017  . Family history of cleft lip and palate   . Fetal cleft lip affecting antepartum care of mother, fetus 1   . History of abnormal cervical Pap smear 08/19/2016  . Supervision of normal intrauterine pregnancy in multigravida in second trimester 08/19/2016  . Obesity (BMI 35.0-39.9 without comorbidity) 08/19/2016  . Amenorrhea 07/24/2016     1. Postpartum care and examination immediately after delivery   2. Post-operative state   3. Birth control counseling     Patient doing well postpartum.   Plan:            1.  May resume normal activities with exception of heavy lifting.  2.  Discussed using antifungal cream.   3.  Breastfeeding and Birth Control Breastfeeding and birth control were discussed in detail.  The patient understands that breastfeeding is not a reliable form of birth control.  We have discussed the use of Progesterone-only birth control pills and combination OCPs.  I have informed her that Progesterone only pills are safe with breastfeeding and very effective when used in combination with full-time breastfeeding.  I have stressed that when she begins to wean from breastfeeding, Progesterone-only pills become less effective and switching to another form of birth control is recommended.  We have also discussed the use of combination OCPs with breastfeeding.  I have informed her that OCPs are safe with breastfeeding even though a small amount of the drug is carried in breast milk.  We have discussed the fact  that combination OCPs can reduce the amount of breast milk produced.  I have made her aware that in rare instances this can lead to discontinuation of breastfeeding.  The use of Mirena IUD was discussed and she has been made aware that this is safe with breastfeeding.  All her questions were answered and she was given appropriate literature on birth control methods.  Orders No orders of the defined types were placed in this encounter.   No orders of the defined types were placed in this encounter.     F/U  Return in about 3 months (around 07/15/2017).  patient will inform us when she has reached a decision regarding birth control.  Elonda Husky, M.D. 04/14/2017 9:34 AM

## 2017-06-02 ENCOUNTER — Encounter: Payer: Self-pay | Admitting: Obstetrics and Gynecology

## 2017-06-03 ENCOUNTER — Telehealth: Payer: Self-pay

## 2017-06-03 MED ORDER — METOCLOPRAMIDE HCL 10 MG PO TABS: 10.0000 mg | ORAL_TABLET | Freq: Three times a day (TID) | ORAL | 0 refills | Status: DC

## 2017-06-03 NOTE — Telephone Encounter (Signed)
Script escribed amd confirmation received. Pt informed

## 2017-06-29 ENCOUNTER — Encounter: Payer: Self-pay | Admitting: Obstetrics and Gynecology

## 2017-07-15 ENCOUNTER — Encounter: Payer: Self-pay | Admitting: Obstetrics and Gynecology

## 2017-07-15 ENCOUNTER — Ambulatory Visit (INDEPENDENT_AMBULATORY_CARE_PROVIDER_SITE_OTHER): Payer: Medicaid Other | Admitting: Obstetrics and Gynecology

## 2017-07-15 VITALS — BP 124/80 | HR 79 | Ht 63.0 in | Wt 205.4 lb

## 2017-07-15 DIAGNOSIS — Z3009 Encounter for other general counseling and advice on contraception: Secondary | ICD-10-CM

## 2017-07-15 NOTE — Progress Notes (Signed)
HPI:      [name] is a 27 y.o. [id number] who LMP was Patient's last menstrual period was 07/08/2017 (exact date).  Subjective:   She presents today 3 months after delivery.  She continues to pump.  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   Patient has been considering birth control but says that she is not currently sexually active and thinks she will wait until she discontinues breast-feeding.  She would like to hear about different methods. She is still within the previous year for Pap smear so must wait another month for annual Pap.    Hx: The following portions of the patient's history were reviewed and updated as appropriate:             She  has a past medical history of Anxiety, Depression, GERD (gastroesophageal reflux disease), History of abnormal cervical Pap smear (08/19/2016), and Migraines. She does not have any pertinent problems on file. She  has a past surgical history that includes Cholecystectomy and Cesarean section (N/A, 03/05/2017). Her family history includes Asthma in her brother; Diabetes in her maternal grandmother; Heart failure in her maternal grandmother; Hyperlipidemia in her mother; Stroke in her maternal grandmother; Thyroid disease in her other. She  reports that she quit smoking about a year ago. Her smoking use included cigarettes. She smoked 1.00 pack per day. she has never used smokeless tobacco. She reports that she does not drink alcohol or use drugs. She is allergic to bee venom.       Review of Systems:  Review of Systems  Constitutional: Denied constitutional symptoms, night sweats, recent illness, fatigue, fever, insomnia and weight loss.  Eyes: Denied eye symptoms, eye pain, photophobia, vision change and visual disturbance.  Ears/Nose/Throat/Neck: Denied ear, nose, throat or neck symptoms, hearing loss, nasal discharge, sinus congestion and sore throat.  Cardiovascular: Denied cardiovascular  symptoms, arrhythmia, chest pain/pressure, edema, exercise intolerance, orthopnea and palpitations.  Respiratory: Denied pulmonary symptoms, asthma, pleuritic pain, productive sputum, cough, dyspnea and wheezing.  Gastrointestinal: Denied, gastro-esophageal reflux, melena, nausea and vomiting.  Genitourinary: Denied genitourinary symptoms including symptomatic vaginal discharge, pelvic relaxation issues, and urinary complaints.  Musculoskeletal: Denied musculoskeletal symptoms, stiffness, swelling, muscle weakness and myalgia.  Dermatologic: Denied dermatology symptoms, rash and scar.  Neurologic: Denied neurology symptoms, dizziness, headache, neck pain and syncope.  Psychiatric: Denied psychiatric symptoms, anxiety and depression.  Endocrine: Denied endocrine symptoms including hot flashes and night sweats.   Meds:   Current Outpatient Medications on File Prior to Visit  Medication Sig Dispense Refill  . acetaminophen (TYLENOL) 500 MG tablet Take 1,000 mg by mouth every 8 (eight) hours as needed for mild pain or headache.    . calcium carbonate (TUMS - DOSED IN MG ELEMENTAL CALCIUM) 500 MG chewable tablet Chew 2 tablets by mouth daily as needed for indigestion or heartburn.    . Prenatal Multivit-Min-Fe-FA (PRENATAL VITAMINS) 0.8 MG tablet Take 1 tablet by mouth daily.     No current facility-administered medications on file prior to visit.     Objective:     Vitals:   07/15/17 0829  BP: 124/80  Pulse: 79                Assessment:    G2P2002 Patient Active Problem List   Diagnosis Date Noted  . Delivery by cesarean section using transverse incision of lower segment of uterus 03/05/2017  . Excessive fetal growth affecting management of mother in third  trimester, antepartum 02/04/2017  . Gastroesophageal reflux in pregnancy in third trimester 01/19/2017  . Family history of cleft lip and palate   . Fetal cleft lip affecting antepartum care of mother, fetus 1   . History of  abnormal cervical Pap smear 08/19/2016  . Supervision of normal intrauterine pregnancy in multigravida in second trimester 08/19/2016  . Obesity (BMI 35.0-39.9 without comorbidity) 08/19/2016  . Amenorrhea 07/24/2016     1. Birth control counseling        Plan:            1.  Birth Control I discussed multiple birth control options and methods with the patient.  The risks and benefits of each were reviewed. Patient thinks that she will wait for birth control until she discontinues breast-feeding.  I have made her aware that breast-feeding does not prevent pregnancy and should not be counted on for absolute birth control.  She says that she is currently not sexually active and does not see this as an issue.   Orders No orders of the defined types were placed in this encounter.   No orders of the defined types were placed in this encounter.     F/U  Return in about 6 weeks (around 08/26/2017) for Annual Physical. I spent 15 minutes with this patient of which greater than 50% was spent discussing birth control methods, breast-feeding, cleft lip and palate, normal GYN follow-up schedule including Pap smears.  Elonda Husky, M.D. 07/15/2017 8:59 AM

## 2017-08-08 ENCOUNTER — Encounter: Payer: Self-pay | Admitting: Obstetrics and Gynecology

## 2017-08-18 ENCOUNTER — Encounter: Payer: Self-pay | Admitting: Obstetrics and Gynecology

## 2017-09-08 ENCOUNTER — Ambulatory Visit (INDEPENDENT_AMBULATORY_CARE_PROVIDER_SITE_OTHER): Payer: Medicaid Other | Admitting: Obstetrics and Gynecology

## 2017-09-08 ENCOUNTER — Encounter: Payer: Self-pay | Admitting: Obstetrics and Gynecology

## 2017-09-08 ENCOUNTER — Encounter: Payer: Medicaid Other | Admitting: Obstetrics and Gynecology

## 2017-09-08 VITALS — BP 120/78 | HR 96 | Ht 63.5 in | Wt 204.1 lb

## 2017-09-08 DIAGNOSIS — Z3009 Encounter for other general counseling and advice on contraception: Secondary | ICD-10-CM | POA: Diagnosis not present

## 2017-09-08 DIAGNOSIS — Z30011 Encounter for initial prescription of contraceptive pills: Secondary | ICD-10-CM

## 2017-09-08 DIAGNOSIS — Z0000 Encounter for general adult medical examination without abnormal findings: Secondary | ICD-10-CM

## 2017-09-08 MED ORDER — DESOGESTREL-ETHINYL ESTRADIOL 0.15-0.02/0.01 MG (21/5) PO TABS
1.0000 | ORAL_TABLET | Freq: Every day | ORAL | 3 refills | Status: DC
Start: 2017-09-08 — End: 2018-04-27

## 2017-09-08 NOTE — Progress Notes (Signed)
HPI:      [name] is a 27 y.o. [id number] who LMP was Patient's last menstrual period was 08/08/2017.  Subjective:   She presents today for her annual examination.  She has now begun to wean from breast-feeding.  She desires birth control at this time.  She states that she would like to take birth control pills. She has no complaints.    Hx: The following portions of the patient's history were reviewed and updated as appropriate:             She  has a past medical history of Anxiety, Depression, GERD (gastroesophageal reflux disease), History of abnormal cervical Pap smear (08/19/2016), and Migraines. She does not have any pertinent problems on file. She  has a past surgical history that includes Cholecystectomy and Cesarean section (N/A, 03/05/2017). Her family history includes Asthma in her brother; Diabetes in her maternal grandmother; Heart failure in her maternal grandmother; Hyperlipidemia in her mother; Stroke in her maternal grandmother; Thyroid disease in her other. She  reports that she quit smoking about 14 months ago. Her smoking use included cigarettes. She smoked 1.00 pack per day. she has never used smokeless tobacco. She reports that she does not drink alcohol or use drugs. She has a current medication list which includes the following prescription(s): calcium carbonate, prenatal vitamins, and desogestrel-ethinyl estradiol. She is allergic to bee venom.       Review of Systems:  Review of Systems  Constitutional: Denied constitutional symptoms, night sweats, recent illness, fatigue, fever, insomnia and weight loss.  Eyes: Denied eye symptoms, eye pain, photophobia, vision change and visual disturbance.  Ears/Nose/Throat/Neck: Denied ear, nose, throat or neck symptoms, hearing loss, nasal discharge, sinus congestion and sore throat.  Cardiovascular: Denied cardiovascular symptoms, arrhythmia, chest pain/pressure, edema, exercise intolerance, orthopnea and  palpitations.  Respiratory: Denied pulmonary symptoms, asthma, pleuritic pain, productive sputum, cough, dyspnea and wheezing.  Gastrointestinal: Denied, gastro-esophageal reflux, melena, nausea and vomiting.  Genitourinary: Denied genitourinary symptoms including symptomatic vaginal discharge, pelvic relaxation issues, and urinary complaints.  Musculoskeletal: Denied musculoskeletal symptoms, stiffness, swelling, muscle weakness and myalgia.  Dermatologic: Denied dermatology symptoms, rash and scar.  Neurologic: Denied neurology symptoms, dizziness, headache, neck pain and syncope.  Psychiatric: Denied psychiatric symptoms, anxiety and depression.  Endocrine: Denied endocrine symptoms including hot flashes and night sweats.   Meds:   Current Outpatient Medications on File Prior to Visit  Medication Sig Dispense Refill  . calcium carbonate (TUMS - DOSED IN MG ELEMENTAL CALCIUM) 500 MG chewable tablet Chew 2 tablets by mouth daily as needed for indigestion or heartburn.    . Prenatal Multivit-Min-Fe-FA (PRENATAL VITAMINS) 0.8 MG tablet Take 1 tablet by mouth daily.     No current facility-administered medications on file prior to visit.     Objective:     Vitals:   09/08/17 1056  BP: 120/78  Pulse: 96              Physical examination General NAD, Conversant  HEENT Atraumatic; Op clear with mmm.  Normo-cephalic. Pupils reactive. Anicteric sclerae  Thyroid/Neck Smooth without nodularity or enlargement. Normal ROM.  Neck Supple.  Skin No rashes, lesions or ulceration. Normal palpated skin turgor. No nodularity.  Breasts:  Enlarged consistent with breast-feeding.  Symmetric.  No axillary adenopathy.  Lungs: Clear to auscultation.No rales or wheezes. Normal Respiratory effort, no retractions.  Heart: NSR.  No murmurs or rubs appreciated. No periferal edema  Abdomen: Soft.  Non-tender.  No masses.  No  HSM. No hernia  Extremities: Moves all appropriately.  Normal ROM for age. No  lymphadenopathy.  Neuro: Oriented to PPT.  Normal mood. Normal affect.     Pelvic:   Vulva: Normal appearance.  No lesions.  Vagina: No lesions or abnormalities noted.  Support: Normal pelvic support.  Urethra No masses tenderness or scarring.  Meatus Normal size without lesions or prolapse.  Cervix: Normal appearance.  No lesions.  Anus: Normal exam.  No lesions.  Perineum: Normal exam.  No lesions.        Bimanual   Uterus: Normal size.  Non-tender.  Mobile.  AV.  Adnexae: No masses.  Non-tender to palpation.  Cul-de-sac: Negative for abnormality.      Assessment:    [id number] Patient Active Problem List   Diagnosis Date Noted  . Delivery by cesarean section using transverse incision of lower segment of uterus 03/05/2017  . Excessive fetal growth affecting management of mother in third trimester, antepartum 02/04/2017  . Gastroesophageal reflux in pregnancy in third trimester 01/19/2017  . Family history of cleft lip and palate   . Fetal cleft lip affecting antepartum care of mother, fetus 1   . History of abnormal cervical Pap smear 08/19/2016  . Supervision of normal intrauterine pregnancy in multigravida in second trimester 08/19/2016  . Obesity (BMI 35.0-39.9 without comorbidity) 08/19/2016  . Amenorrhea 07/24/2016     1. Encounter for annual physical exam   2. Birth control counseling   3. Initiation of OCP (BCP)        Plan:            1.  Basic Screening Recommendations The basic screening recommendations for asymptomatic women were discussed with the patient during her visit.  The age-appropriate recommendations were discussed with her and the rational for the tests reviewed.  When I am informed by the patient that another primary care physician has previously obtained the age-appropriate tests and they are up-to-date, only outstanding tests are ordered and referrals given as necessary.  Abnormal results of tests will be discussed with her when all of her  results are completed. Pap performed 2.  Birth Control I discussed multiple birth control options and methods with the patient.  The risks and benefits of each were reviewed. OCPs The risks /benefits of OCPs have been explained to the patient in detail.  Product literature has been given to her.  I have instructed her in the use of OCPs and have given her literature reinforcing this information.  I have explained to the patient that OCPs are not as effective for birth control during the first month of use, and that another form of contraception should be used during this time.  Both first-day start and Sunday start have been explained.  The risks and benefits of each was discussed.  She has been made aware of  the fact that other medications may affect the efficacy of OCPs.  I have answered all of her questions, and I believe that she has an understanding of the effectiveness and use of OCPs.  Orders No orders of the defined types were placed in this encounter.    Meds ordered this encounter  Medications  . desogestrel-ethinyl estradiol (MIRCETTE) 0.15-0.02/0.01 MG (21/5) tablet    Sig: Take 1 tablet by mouth at bedtime.    Dispense:  1 Package    Refill:  3        F/U  No Follow-up on file.  Elonda Husky, M.D. 09/08/2017 11:20 AM

## 2017-09-09 NOTE — Addendum Note (Signed)
Addended by: Rosine BeatLONTZ, Dina Warbington L on: 09/09/2017 09:43 AM   Modules accepted: Orders

## 2017-09-16 LAB — PAP IG W/ RFLX HPV ASCU: PAP SMEAR COMMENT: 0

## 2017-09-23 ENCOUNTER — Encounter: Payer: Self-pay | Admitting: Obstetrics and Gynecology

## 2017-11-30 IMAGING — US US MFM OB FOLLOW-UP
1 series · 12 of 28 positions shown · non-contrast
Comparison: none

PATIENT INFO:

PERFORMED BY:
SERVICE(S) PROVIDED:
INDICATIONS:
31 weeks gestation of pregnancy
FETAL EVALUATION:
Num Of Fetuses:     1
Fetal Heart         125
Rate(bpm):
Presentation:       Breech
Placenta:           Anterior
AFI Sum(cm)     %Tile       Largest Pocket(cm)
10.3            17
RUQ(cm)       RLQ(cm)       LUQ(cm)        LLQ(cm)
4.4           3.1           1
BIOMETRY:
BPD:      84.1  mm     G. Age:  33w 6d         97  %    CI:          72.4  %    70 - 86
FL/HC:       19.7  %    19.3 -
HC:      314.4  mm     G. Age:  35w 2d       > 97  %    HC/AC:       1.06       0.96 -
AC:      296.9  mm     G. Age:  33w 5d       > 97  %    FL/BPD:      73.6  %    71 - 87
FL:       61.9  mm     G. Age:  32w 0d         62  %    FL/AC:       20.8  %    20 - 24
HUM:      54.9  mm     G. Age:  32w 0d         68  %
Est. FW:    9067   gm   4 lb 13 oz      95  %
GESTATIONAL AGE:
LMP:           35w 0d        Date:  05/07/16                 EDD:   02/11/17
U/S Today:     33w 5d                                        EDD:   02/20/17
Best:          31w 1d     Det. By:  Early Ultrasound         EDD:   03/10/17
(08/19/16)
ANATOMY:
Cranium:               Normal appearance      Aortic Arch:            Visualized
previously
Cavum:                 Visualized             Ductal Arch:            Visualized
previously                                     previously
Ventricles:            Normal appearance      Diaphragm:              Visualized
Choroid Plexus:        Within Normal Limits   Stomach:                Seen
Cerebellum:            Visualized             Abdomen:                Normal appearance
Posterior Fossa:       Visualized             Abdominal Wall:         Visualized
Face:                  See impression         Cord Vessels:           3 vessels,
visualized previously
Lips:                  Visualized             Kidneys:                Normal appearance
Thoracic:              Visualized             Bladder:                Seen
Heart:                 4-Chamber view         Spine:                  Visualized
appears normal                                 previously
RVOT:                  Normal appearance      Upper Extremities:      Visualized
LVOT:                  Normal appearance      Lower Extremities:      Visualized

[Series 1: us mfm ob follow-up · 0.21mm/px · 12 of 43 slices shown]
[im 2/43]
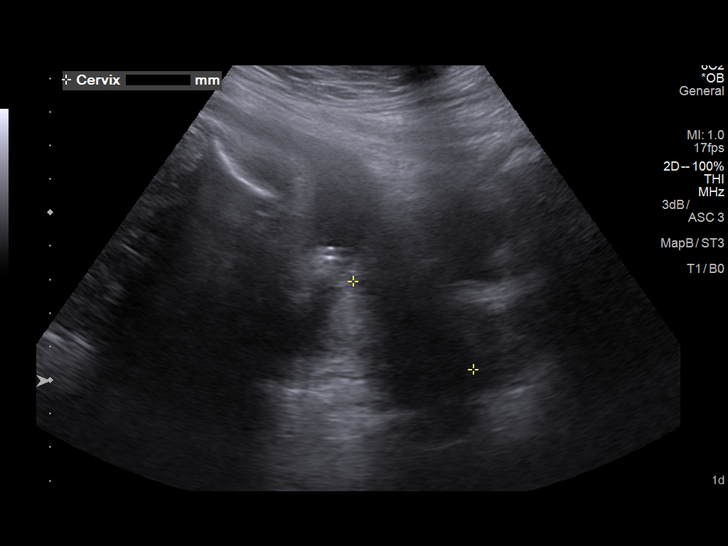
[im 5/43]
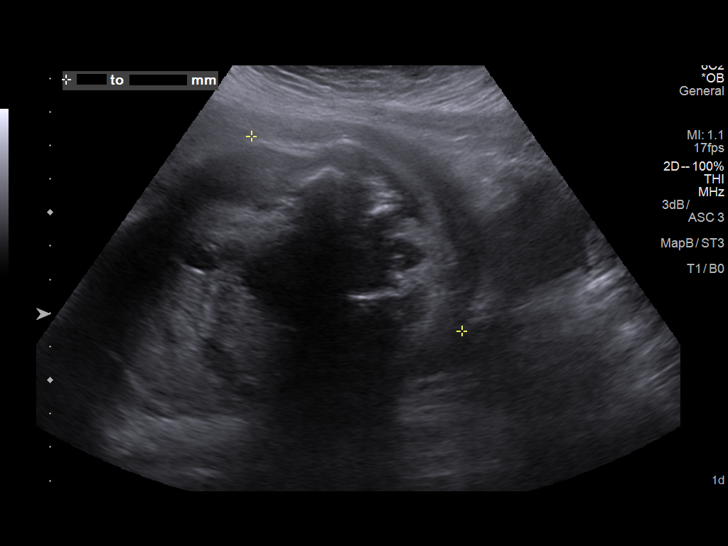
[im 8/43]
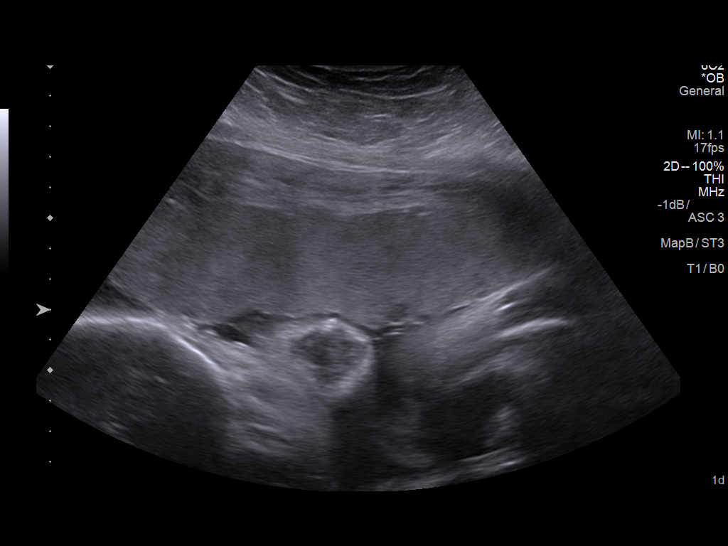
[im 13/43]
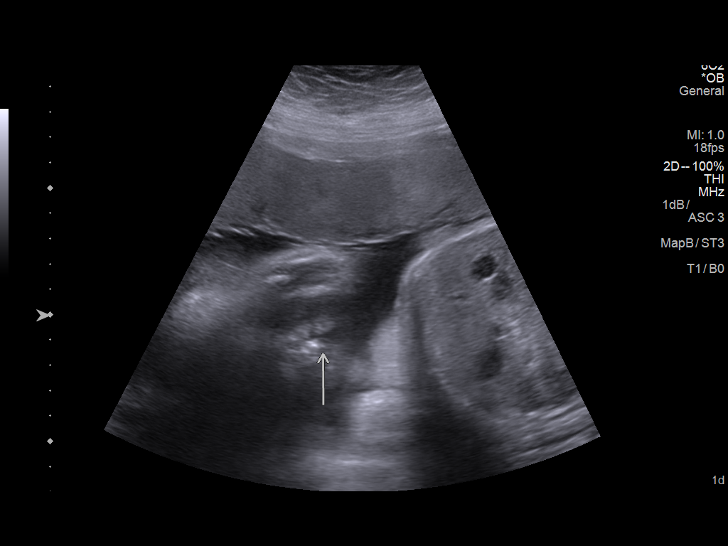
[im 16/43]
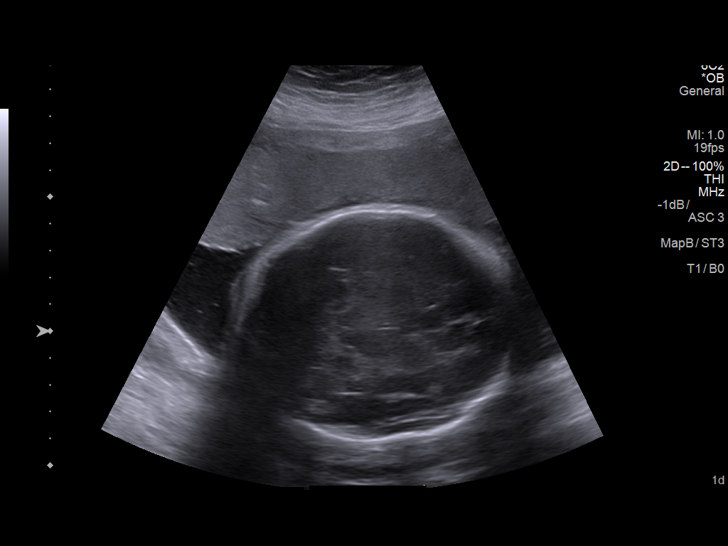
[im 19/43]
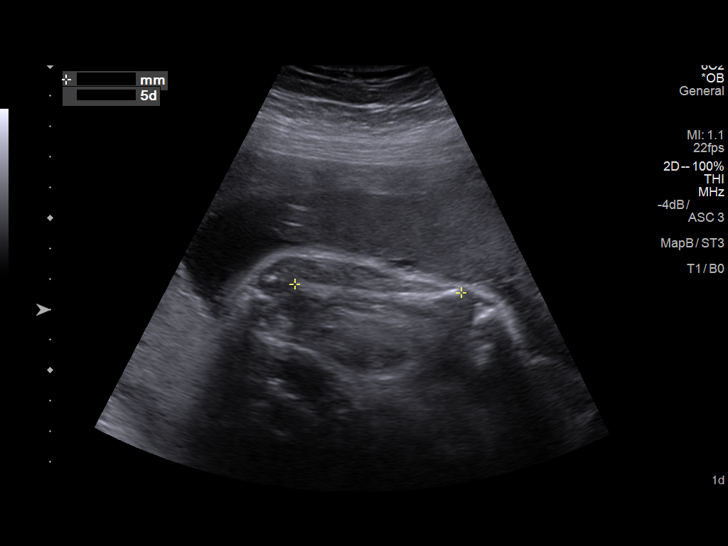
[im 24/43]
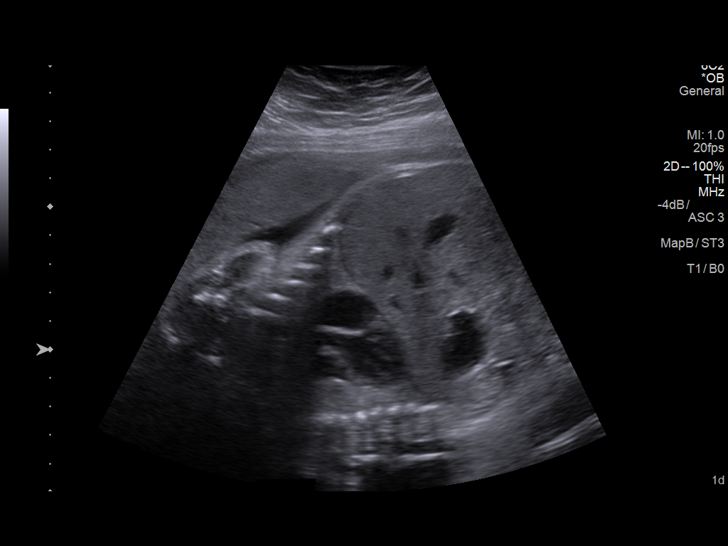
[im 27/43]
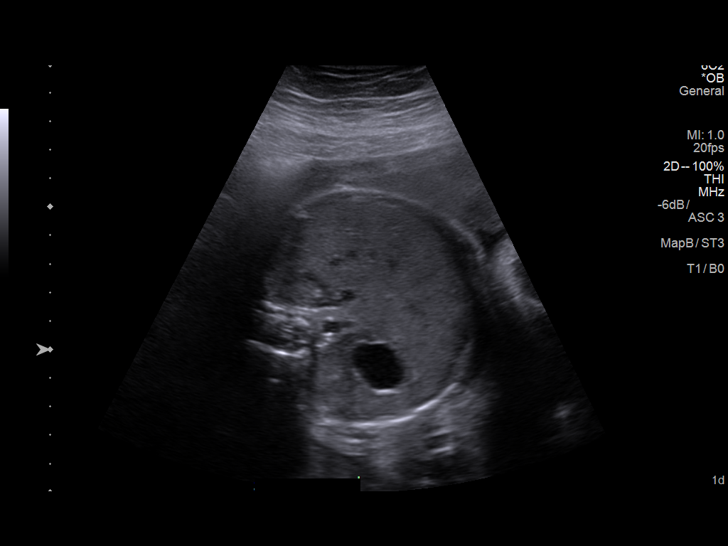
[im 30/43]
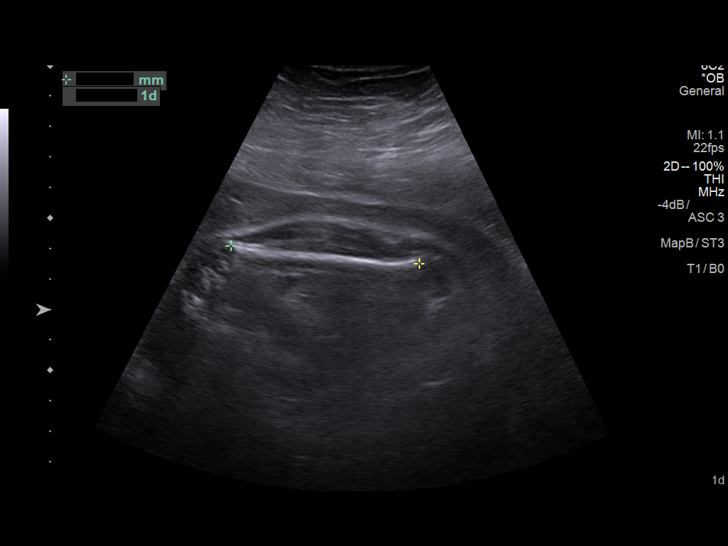
[im 35/43]
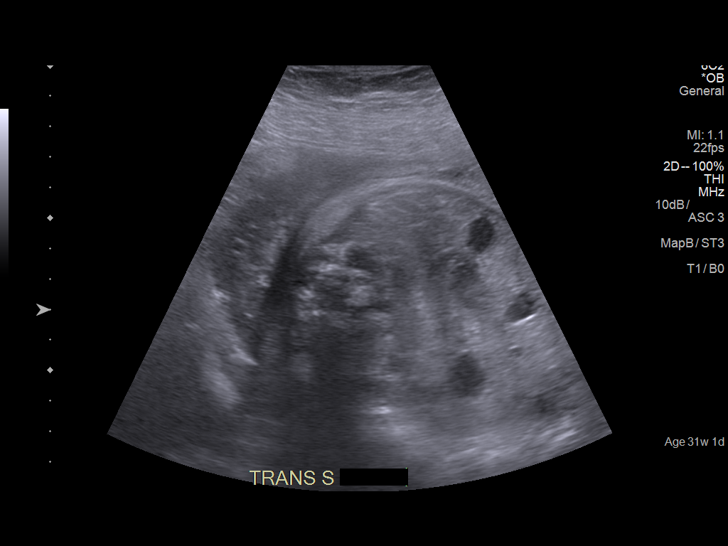
[im 38/43]
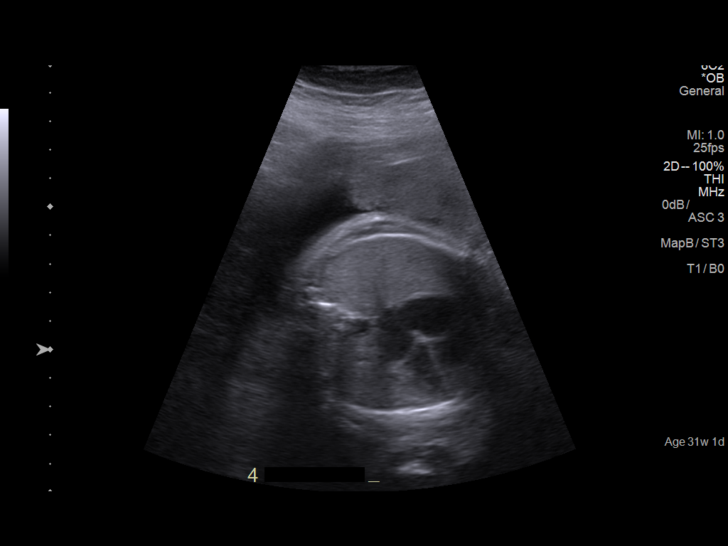
[im 41/43]
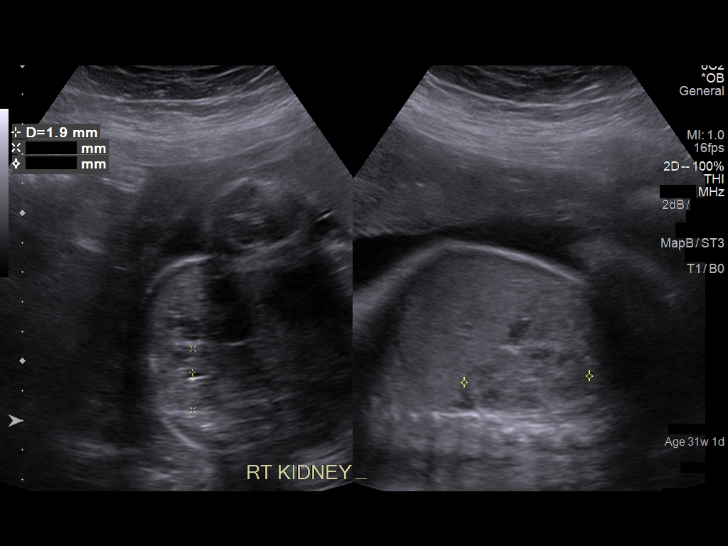

[12 of 28 positions shown; findings below may reference images not displayed]

IMPRESSION: Dear Dr.  Dabre,

Thank you for referring your patient for follow-up ultrasound
for growth.  The fetus has previously been identified as
having bilateral cleft lip with a palate that is apparently intact.
The family history is also significant for cleft lip (father of
fetus, uncle, cousin of fetus). Dating is by ultrasound
performed at [facility] on 07/29/16; measurements were
consistent with 8 weeks gestation.  They previously had the
opportunity to meet with our genetic counselor. Please see
that note for details. The patient  had negative cell free fetal
DNA screening.  A fetal echo was normal on 11/23/2016.

Today, ultrasound demonstrates a single, live intrauterine
pregnancy at 33 weeks 5 days.

There is fetal macrosomia with an EFW at the 95th percentile
and an abdominal circumference greater than the 97th
percentile.  The patient reports that she passed her glucose
screen.

Other than bilateral cleft lip, the fetal anatomy, including the
fetal palate, appears normal today or has been visualized
previously and appeared normal.

The patient has met with the [facility] Team.  The family has been instructed in
infant feeding and been counseled about future surgeries.
The plan is for the patient to deliver here.

RECOMMENDATIONS:

The patient was scheduled for follow-up ultrasound for growth
in 4 weeks.

Thank you for allowing us to participate in your patient's care.

assistance.
[place] Harpa Ronlor

## 2017-12-09 ENCOUNTER — Ambulatory Visit (INDEPENDENT_AMBULATORY_CARE_PROVIDER_SITE_OTHER): Payer: Medicaid Other | Admitting: Obstetrics and Gynecology

## 2017-12-09 ENCOUNTER — Encounter: Payer: Self-pay | Admitting: Obstetrics and Gynecology

## 2017-12-09 VITALS — BP 122/84 | HR 83 | Wt 217.7 lb

## 2017-12-09 DIAGNOSIS — Z3009 Encounter for other general counseling and advice on contraception: Secondary | ICD-10-CM | POA: Diagnosis not present

## 2017-12-09 DIAGNOSIS — Z3041 Encounter for surveillance of contraceptive pills: Secondary | ICD-10-CM

## 2017-12-09 MED ORDER — DESOGESTREL-ETHINYL ESTRADIOL 0.15-0.02/0.01 MG (21/5) PO TABS
1.0000 | ORAL_TABLET | Freq: Every day | ORAL | 2 refills | Status: DC
Start: 2017-12-09 — End: 2018-08-17

## 2017-12-09 NOTE — Progress Notes (Signed)
Pt stated that the birth control she started 3 months ago is working well but she think it is causing her to gain weight. No other problems or concerns.

## 2017-12-09 NOTE — Progress Notes (Signed)
HPI:      Alicia Hendricks is a 27 y.o. [id number] who LMP was Patient's last menstrual period was 12/06/2017.  Subjective:   She presents today for follow-up of OCPs.  She likes them and is having normal regular cycles.  She is not missing pills.  Her only concern is that she believes she has gained some weight and wonders if OCPs are responsible.  She would also like to know if there are other forms of birth control she could consider. She continues to wean from breast-feeding- breast-feeding only a couple times a day.    Hx: The following portions of the patient's history were reviewed and updated as appropriate:             She  has a past medical history of Anxiety, Depression, GERD (gastroesophageal reflux disease), History of abnormal cervical Pap smear (08/19/2016), and Migraines. She does not have any pertinent problems on file. She  has a past surgical history that includes Cholecystectomy and Cesarean section (N/A, 03/05/2017). Her family history includes Asthma in her brother; Diabetes in her maternal grandmother; Heart failure in her maternal grandmother; Hyperlipidemia in her mother; Stroke in her maternal grandmother; Thyroid disease in her other. She  reports that she quit smoking about 17 months ago. Her smoking use included cigarettes. She smoked 1.00 pack per day. She has never used smokeless tobacco. She reports that she does not drink alcohol or use drugs. She has a current medication list which includes the following prescription(s): calcium carbonate, desogestrel-ethinyl estradiol, and prenatal vitamins. She is allergic to bee venom.       Review of Systems:  Review of Systems  Constitutional: Denied constitutional symptoms, night sweats, recent illness, fatigue, fever, insomnia and weight loss.  Eyes: Denied eye symptoms, eye pain, photophobia, vision change and visual disturbance.  Ears/Nose/Throat/Neck: Denied ear, nose, throat or neck symptoms, hearing loss, nasal  discharge, sinus congestion and sore throat.  Cardiovascular: Denied cardiovascular symptoms, arrhythmia, chest pain/pressure, edema, exercise intolerance, orthopnea and palpitations.  Respiratory: Denied pulmonary symptoms, asthma, pleuritic pain, productive sputum, cough, dyspnea and wheezing.  Gastrointestinal: Denied, gastro-esophageal reflux, melena, nausea and vomiting.  Genitourinary: Denied genitourinary symptoms including symptomatic vaginal discharge, pelvic relaxation issues, and urinary complaints.  Musculoskeletal: Denied musculoskeletal symptoms, stiffness, swelling, muscle weakness and myalgia.  Dermatologic: Denied dermatology symptoms, rash and scar.  Neurologic: Denied neurology symptoms, dizziness, headache, neck pain and syncope.  Psychiatric: Denied psychiatric symptoms, anxiety and depression.  Endocrine: Denied endocrine symptoms including hot flashes and night sweats.   Meds:   Current Outpatient Medications on File Prior to Visit  Medication Sig Dispense Refill  . calcium carbonate (TUMS - DOSED IN MG ELEMENTAL CALCIUM) 500 MG chewable tablet Chew 2 tablets by mouth daily as needed for indigestion or heartburn.    . desogestrel-ethinyl estradiol (MIRCETTE) 0.15-0.02/0.01 MG (21/5) tablet Take 1 tablet by mouth at bedtime. 1 Package 3  . Prenatal Multivit-Min-Fe-FA (PRENATAL VITAMINS) 0.8 MG tablet Take 1 tablet by mouth daily.     No current facility-administered medications on file prior to visit.     Objective:     Vitals:   12/09/17 0811  BP: 122/84  Pulse: 83                Assessment:    G2P2002 Patient Active Problem List   Diagnosis Date Noted  . Delivery by cesarean section using transverse incision of lower segment of uterus 03/05/2017  . Excessive fetal growth affecting management of  mother in third trimester, antepartum 02/04/2017  . Gastroesophageal reflux in pregnancy in third trimester 01/19/2017  . Family history of cleft lip and palate    . Fetal cleft lip affecting antepartum care of mother, fetus 1   . History of abnormal cervical Pap smear 08/19/2016  . Supervision of normal intrauterine pregnancy in multigravida in second trimester 08/19/2016  . Obesity (BMI 35.0-39.9 without comorbidity) 08/19/2016  . Amenorrhea 07/24/2016     1. Oral contraceptive pill surveillance   2. Birth control counseling     Patient concerned regarding weight gain and OCPs and would like to consider other forms of birth control.   Plan:            1.  Birth Control I discussed multiple birth control options and methods with the patient.  The risks and benefits of each were reviewed.  We specifically discussed IUD and the risks and benefits of this form of birth control. 2.  We have discussed OCPs and weight gain and I reassured her that multiple studies have shown no significant weight gain with OCPs.  She has decided to continue her current OCPs. Orders No orders of the defined types were placed in this encounter.   No orders of the defined types were placed in this encounter.     F/U  Return in about 9 months (around 09/09/2018) for Annual Physical. I spent 16 minutes involved in the care of this patient of which greater than 50% was spent discussing breast-feeding and birth control, OCPs and weight gain, multiple other forms of birth control options, IUD risks and benefits, timing of birth control change, last Pap smear and annual follow-up.  Elonda Husky, M.D. 12/09/2017 8:44 AM

## 2017-12-28 IMAGING — US US MFM OB FOLLOW-UP
1 series · 12 of 28 positions shown · non-contrast
Comparison: none

am)
PATIENT INFO:

PERFORMED BY:
SERVICE(S) PROVIDED:
INDICATIONS:
35 weeks gestation of pregnancy
FETAL EVALUATION:
Num Of Fetuses:     1
Fetal Heart         130
Rate(bpm):
Presentation:       Cephalic
Placenta:           Anterior
AFI Sum(cm)     %Tile       Largest Pocket(cm)
12.34           38
RUQ(cm)       RLQ(cm)       LUQ(cm)        LLQ(cm)
4.93
BIOMETRY:
BPD:      95.9  mm     G. Age:  39w 1d       > 99  %    CI:        75.04   %    70 - 86
FL/HC:      18.8   %    20.1 -
HC:      351.2  mm     G. Age:  41w 0d       > 97  %    HC/AC:      1.01        0.93 -
AC:       349   mm     G. Age:  38w 6d       > 97  %    FL/BPD:     68.8   %    71 - 87
FL:         66  mm     G. Age:  34w 0d         17  %    FL/AC:      18.9   %    20 - 24
HUM:      61.7  mm     G. Age:  35w 5d         77  %
Est. FW:    4468  gm      7 lb 7 oz   > 97  %
GESTATIONAL AGE:
LMP:           39w 0d        Date:  05/07/16                 EDD:   02/11/17
U/S Today:     38w 2d                                        EDD:   02/16/17
Best:          35w 1d     Det. By:  Early Ultrasound         EDD:   03/10/17
(07/29/16)
ANATOMY:
Cavum:                 Visualized             Stomach:                Seen
previously
Ventricles:            Normal appearance      Abdominal Wall:         Visualized
Cerebellum:            Visualized             Cord Vessels:           3 vessels,
previously                                     visualized previously
Posterior Fossa:       Visualized             Kidneys:                Normal appearance
Face:                  Orbits visualized      Bladder:                Seen
Lips:                  Visualized             Spine:                  Visualized
previously                                     previously
Heart:                 4-Chamber view         Upper Extremities:      Visualized
appears normal                                 previously
RVOT:                  Normal appearance      Lower Extremities:      Visualized
LVOT:                  Normal appearance
CERVIX UTERUS ADNEXA:
Cervix
Length:           3.99  cm.

[Series 1: us mfm ob follow-up · 0.26mm/px · 12 of 48 slices shown]
[im 2/48]
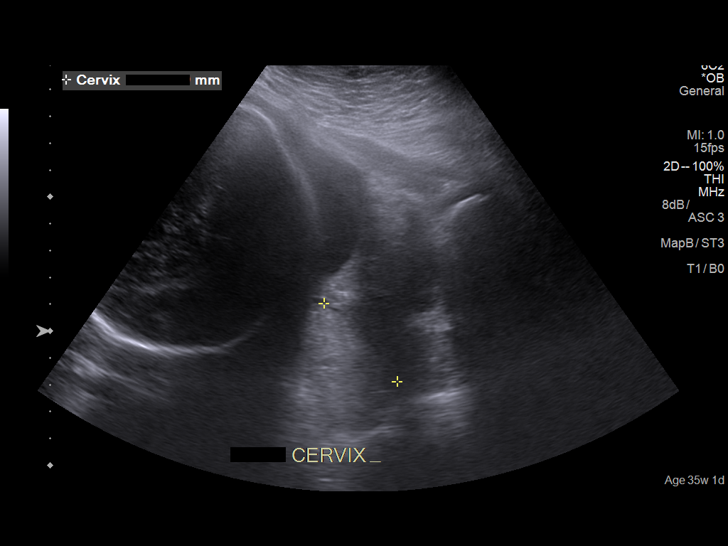
[im 6/48]
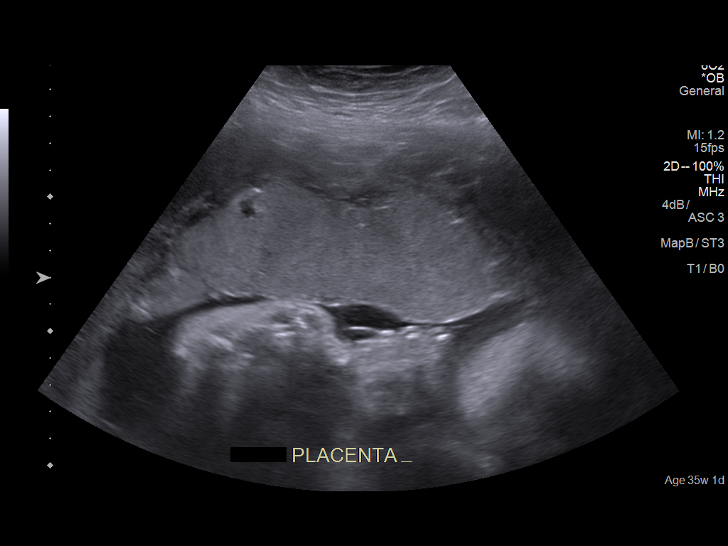
[im 9/48]
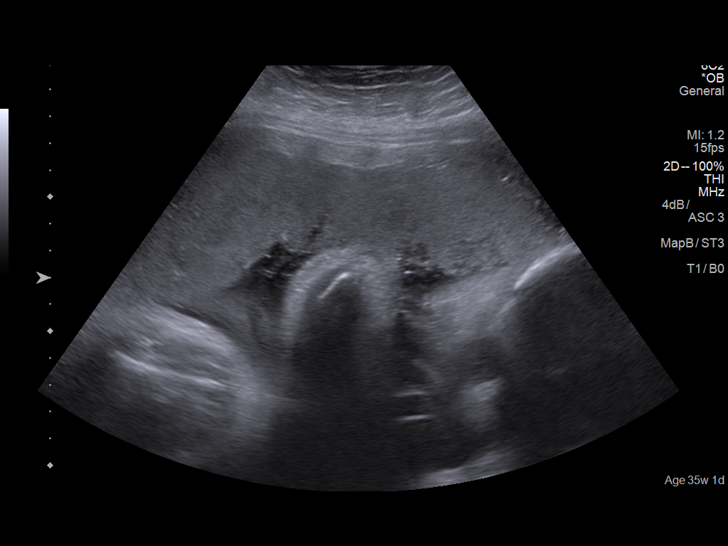
[im 14/48]
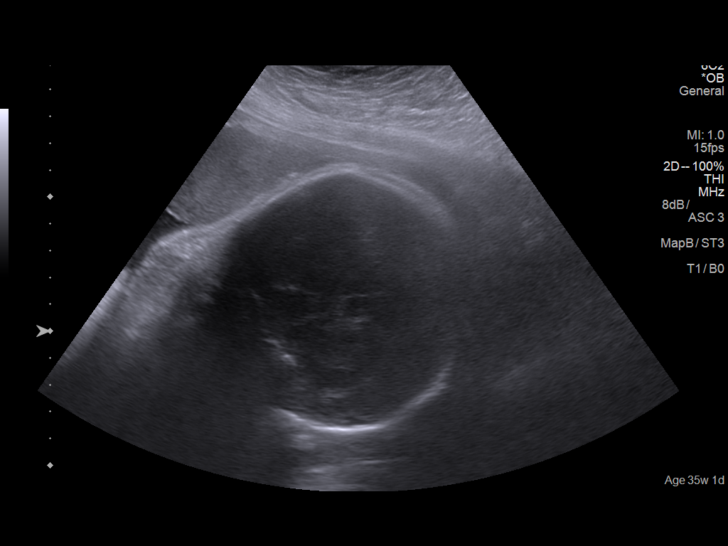
[im 18/48]
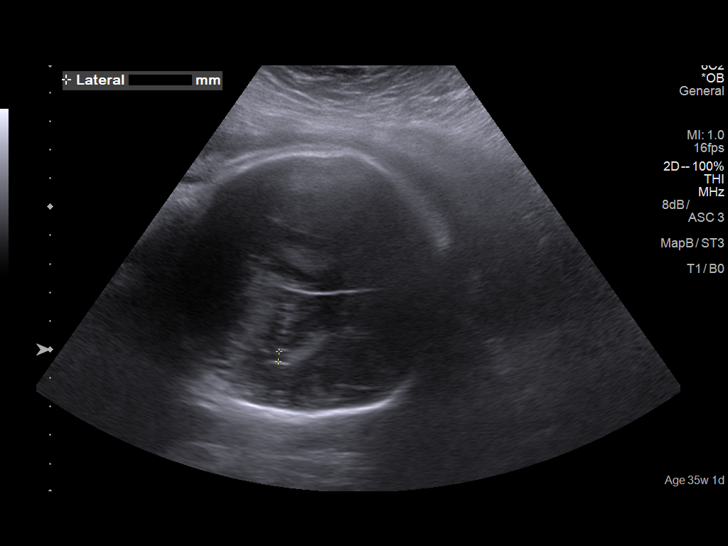
[im 21/48]
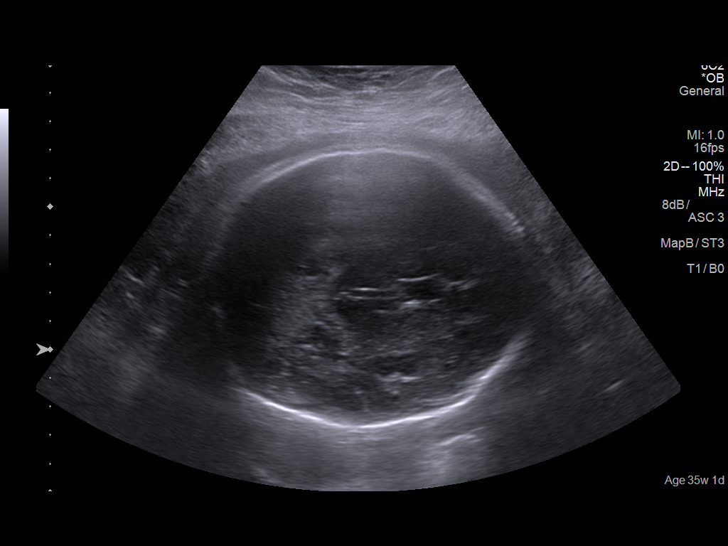
[im 27/48]
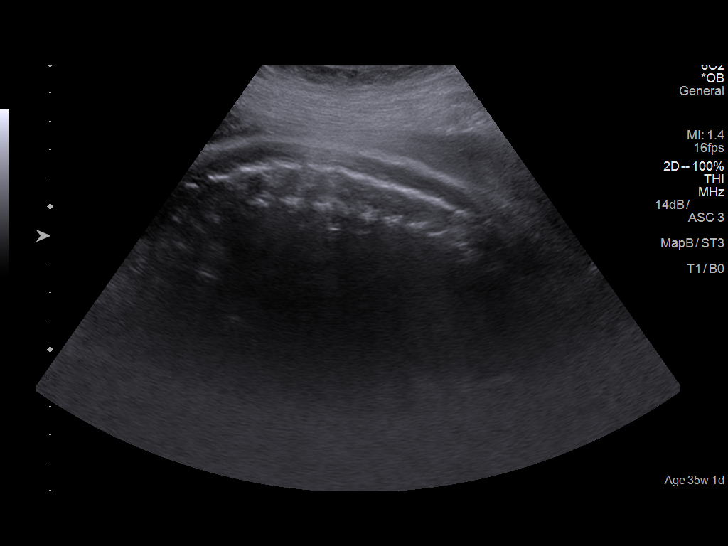
[im 30/48]
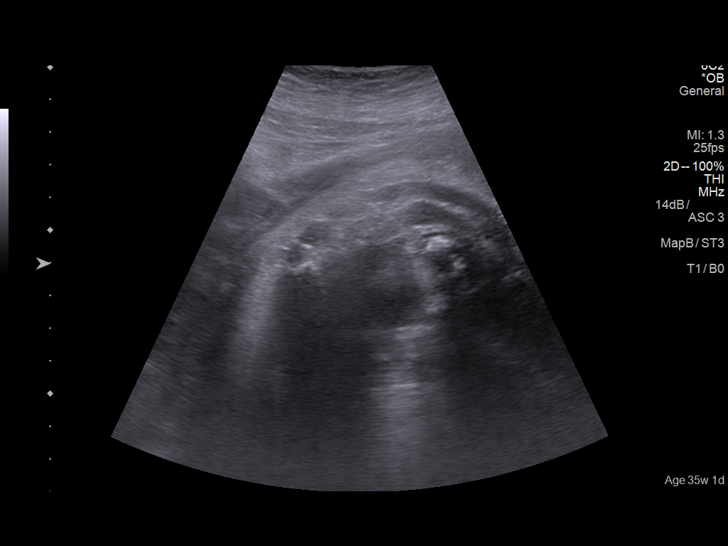
[im 34/48]
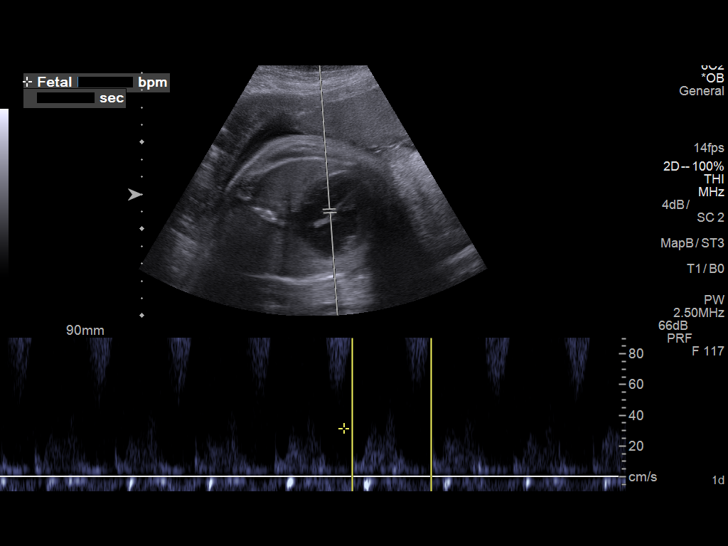
[im 39/48]
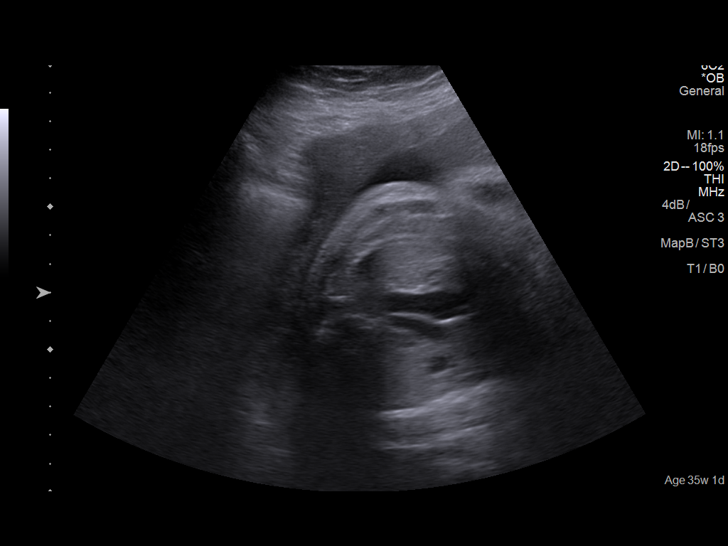
[im 42/48]
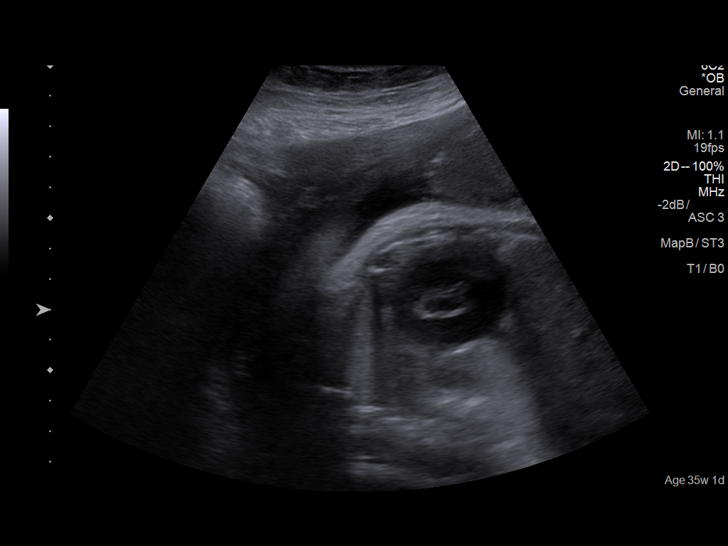
[im 46/48]
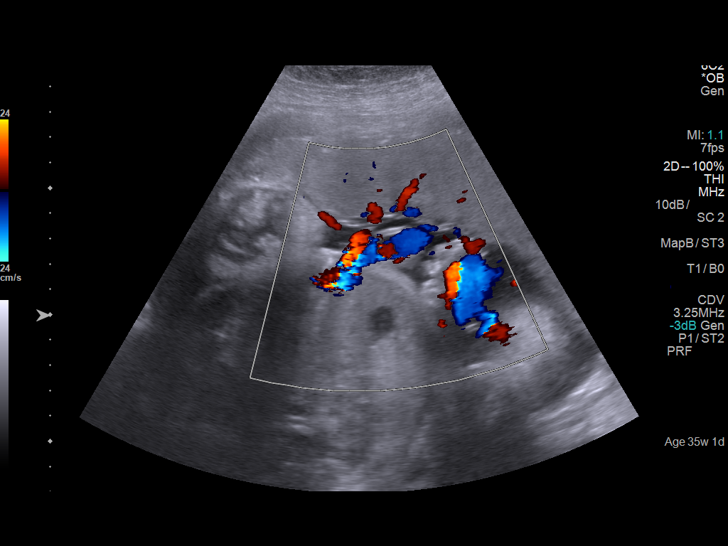

[12 of 28 positions shown; findings below may reference images not displayed]

IMPRESSION: Dear Dr. Oszvald,

Thank you for referring your patient for a fetal growth
evaluation.

There is a singleton gestation with normal amniotic fluid
volume.
weeks gestation at [REDACTED] .

Greater than expected  interval growth noted.
The estimated fetal weight is at the > 97   percentile. VOYER.
Cleft lip again noted- found to be bilateral with intact palate
on prior scan. Father of baby's family has a strong family
history of isolated cleft lip both unilateral and bilateral .
Pt has met with the Cleft lip cleft palate team to discuss infant
feeding - since likely isolated CL the plan is for delivery at
[HOSPITAL]  and pt will breast feed .

Recommend follow-up scan for fetal growth in 3 weeks .
Patient expressed concern regarding excessive growth - her
10 yo son was 7lbs 8 oz at birth.
I told her there is a range of eror with weight estimates,
induction of labor isn't recommended until 39 weeks due to
more complications in early term deliveries, and primary
cesarean isn't recommended in non diabetic patients until
5kg .
Pt reports her diabetes testing was negative.

Thank you for allowing us to participate in your patient's care.
assistance.

## 2018-04-27 ENCOUNTER — Telehealth: Payer: Self-pay

## 2018-04-27 ENCOUNTER — Encounter: Payer: Medicaid Other | Admitting: Obstetrics and Gynecology

## 2018-04-27 ENCOUNTER — Encounter: Payer: Self-pay | Admitting: Obstetrics and Gynecology

## 2018-04-27 ENCOUNTER — Ambulatory Visit (INDEPENDENT_AMBULATORY_CARE_PROVIDER_SITE_OTHER): Payer: Medicaid Other | Admitting: Obstetrics and Gynecology

## 2018-04-27 VITALS — BP 116/78 | HR 98 | Ht 64.0 in | Wt 196.4 lb

## 2018-04-27 DIAGNOSIS — Z0389 Encounter for observation for other suspected diseases and conditions ruled out: Secondary | ICD-10-CM | POA: Diagnosis not present

## 2018-04-27 DIAGNOSIS — W448XXA Other foreign body entering into or through a natural orifice, initial encounter: Secondary | ICD-10-CM

## 2018-04-27 DIAGNOSIS — T192XXA Foreign body in vulva and vagina, initial encounter: Secondary | ICD-10-CM

## 2018-04-27 NOTE — Progress Notes (Signed)
Chief complaint: 1.  Retained tampon  Patient presents for evaluation for possible retained tampon.  She is on her menses.  She woke up in the middle the night and was groggy and was uncertain as to whether she removed her tampon.  She denies vaginal itching, vaginal pain, vaginal bleeding, vaginal discharge, or vaginal odor.  Past Medical History:  Diagnosis Date  . Anxiety   . Depression   . GERD (gastroesophageal reflux disease)   . History of abnormal cervical Pap smear 08/19/2016  . Migraines    migraines   Past Surgical History:  Procedure Laterality Date  . CESAREAN SECTION N/A 03/05/2017   Procedure: PRIMARY CESAREAN SECTION;  Surgeon: Linzie Collin, MD;  Location: [facility] ORS;  Service: Obstetrics;  Laterality: N/A;  . CHOLECYSTECTOMY     Review of systems: Complete review of systems is negative as noted in the HPI for specifics  OBJECTIVE: BP 116/78   Pulse 98   Ht 5\' 4"  (1.626 m)   Wt 196 lb 6.4 oz (89.1 kg)   LMP 04/24/2018 (Exact Date)   Breastfeeding? No   BMI 33.71 kg/m  Pleasant well-appearing female no acute distress.  Alert and oriented. Abdomen: Soft, nontender without organomegaly Bladder: Nontender Pelvic exam: External genitalia-normal BUS-normal Vagina-no evidence of retained tampon; minimal bloody secretions Cervix-no lesions; no cervical motion tenderness Uterus-retroverted, mobile, 1/4 tender, nonenlarged Adnexa-nonpalpable nontender Rectovaginal-normal external exam  ASSESSMENT: 1.  Suspected retained tampon, ruled out on exam 2.  Normal pelvic exam  PLAN: 1.  Reassurance is given 2.  Return for routine wellness exams with Dr. Logan Bores as scheduled.  Herold Harms, MD  Note: This dictation was prepared with Dragon dictation along with smaller phrase technology. Any transcriptional errors that result from this process are unintentional.

## 2018-04-27 NOTE — Patient Instructions (Signed)
1.  No tampon was identified today. 2.  Return as needed or for routine gynecologic care with Dr. Logan Bores as scheduled

## 2018-07-03 ENCOUNTER — Other Ambulatory Visit: Payer: Self-pay

## 2018-07-03 ENCOUNTER — Encounter: Payer: Self-pay | Admitting: Gynecology

## 2018-07-03 ENCOUNTER — Ambulatory Visit
Admission: EM | Admit: 2018-07-03 | Discharge: 2018-07-03 | Disposition: A | Discharge status: Home / Self Care | Payer: Medicaid Other | Attending: Emergency Medicine | Admitting: Emergency Medicine

## 2018-07-03 DIAGNOSIS — J020 Streptococcal pharyngitis: Secondary | ICD-10-CM | POA: Diagnosis not present

## 2018-07-03 DIAGNOSIS — Z87891 Personal history of nicotine dependence: Secondary | ICD-10-CM | POA: Insufficient documentation

## 2018-07-03 DIAGNOSIS — J069 Acute upper respiratory infection, unspecified: Secondary | ICD-10-CM | POA: Diagnosis not present

## 2018-07-03 DIAGNOSIS — R05 Cough: Secondary | ICD-10-CM | POA: Diagnosis present

## 2018-07-03 LAB — RAPID STREP SCREEN (MED CTR MEBANE ONLY): STREPTOCOCCUS, GROUP A SCREEN (DIRECT): POSITIVE — AB

## 2018-07-03 MED ORDER — HYDROCOD POLST-CPM POLST ER 10-8 MG/5ML PO SUER: 5.0000 mL | Freq: Every evening | ORAL | 0 refills | Status: DC | PRN

## 2018-07-03 MED ORDER — AMOXICILLIN 875 MG PO TABS: 875.0000 mg | ORAL_TABLET | Freq: Two times a day (BID) | ORAL | 0 refills | Status: DC

## 2018-07-03 NOTE — ED Triage Notes (Signed)
Per patient with cough / nasal drainage x 3-4 days

## 2018-07-03 NOTE — Discharge Instructions (Signed)
Take medication as prescribed. Rest. Drink plenty of fluids. Over the counter medication as discussed.  ° °Follow up with your primary care physician this week as needed. Return to Urgent care for new or worsening concerns.  ° °

## 2018-07-03 NOTE — ED Provider Notes (Signed)
[facility] ____________________________________________  Time seen: Approximately 2:04 PM  I have reviewed the triage vital signs and the nursing notes.   HISTORY  Chief Complaint URI  HPI Alicia Hendricks is a 27 y.o. female presenting for evaluation of runny nose, nasal congestion and cough present for the last 3 to 4 days.  Also reports accompanying sore throat.  Sore throat currently mild to moderate.  Denies known fevers, has occasionally felt warm.  States cough is disrupting sleep.  No home sick contacts.  Has continued to overall eat and drink well.  Has taken some over-the-counter cough and congestion medication with some improvement, no resolution.  Denies any chest pain, shortness of breath or abdominal pain.  Denies recent sickness.  Reports otherwise doing well.  Patient's last menstrual period was 06/12/2018.denies pregnancy   Past Medical History:  Diagnosis Date  . Anxiety   . Depression   . GERD (gastroesophageal reflux disease)   . History of abnormal cervical Pap smear 08/19/2016  . Migraines    migraines    Patient Active Problem List   Diagnosis Date Noted  . Delivery by cesarean section using transverse incision of lower segment of uterus 03/05/2017  . Family history of cleft lip and palate   . Fetal cleft lip affecting antepartum care of mother, fetus 1   . History of abnormal cervical Pap smear 08/19/2016  . Obesity (BMI 35.0-39.9 without comorbidity) 08/19/2016    Past Surgical History:  Procedure Laterality Date  . CESAREAN SECTION N/A 03/05/2017   Procedure: PRIMARY CESAREAN SECTION;  Surgeon: Linzie CollinEvans, David James, MD;  Location: [facility] ORS;  Service: Obstetrics;  Laterality: N/A;  . CHOLECYSTECTOMY       No current facility-administered medications for this encounter.   Current Outpatient Medications:  .  desogestrel-ethinyl estradiol (MIRCETTE) 0.15-0.02/0.01 MG (21/5) tablet, Take 1 tablet by mouth at bedtime., Disp: 3 Package,  Rfl: 2 .  amoxicillin (AMOXIL) 875 MG tablet, Take 1 tablet (875 mg total) by mouth 2 (two) times daily., Disp: 20 tablet, Rfl: 0 .  chlorpheniramine-HYDROcodone (TUSSIONEX PENNKINETIC ER) 10-8 MG/5ML SUER, Take 5 mLs by mouth at bedtime as needed for cough. do not drive or operate machinery while taking as can cause drowsiness., Disp: 50 mL, Rfl: 0  Allergies Bee venom  Family History  Problem Relation Age of Onset  . Hyperlipidemia Mother   . Asthma Brother   . Diabetes Maternal Grandmother   . Heart failure Maternal Grandmother   . Stroke Maternal Grandmother   . Thyroid disease Other     Social History Social History   Tobacco Use  . Smoking status: Former Smoker    Packs/day: 1.00    Types: Cigarettes    Last attempt to quit: 07/06/2016    Years since quitting: 1.9  . Smokeless tobacco: Never Used  Substance Use Topics  . Alcohol use: No  . Drug use: No    Review of Systems Constitutional: No known fever ENT: Positive sore throat. Cardiovascular: Denies chest pain. Respiratory: Denies shortness of breath. Gastrointestinal: No abdominal pain. Musculoskeletal: Negative for back pain. Skin: Negative for rash.   ____________________________________________   PHYSICAL EXAM:  VITAL SIGNS: ED Triage Vitals  Enc Vitals Group     BP 07/03/18 1014 121/78     Pulse Rate 07/03/18 1014 87     Resp 07/03/18 1014 18     Temp 07/03/18 1014 98 F (36.7 C)     Temp Source 07/03/18 1014 Oral  SpO2 07/03/18 1014 99 %     Weight 07/03/18 1013 190 lb (86.2 kg)     Height 07/03/18 1013 5\' 4"  (1.626 m)     Head Circumference --      Peak Flow --      Pain Score 07/03/18 1013 7     Pain Loc --      Pain Edu? --      Excl. in GC? --    Constitutional: Alert and oriented. Well appearing and in no acute distress. Eyes: Conjunctivae are normal.  Head: Atraumatic. No sinus tenderness to palpation. No swelling. No erythema.  Ears: no erythema, normal TMs bilaterally.    Nose:Nasal congestion   Mouth/Throat: Mucous membranes are moist. Mild pharyngeal erythema.  Mild bilateral tonsillar swelling.  No exudate. Neck: No stridor.  No cervical spine tenderness to palpation. Hematological/Lymphatic/Immunilogical: No cervical lymphadenopathy. Cardiovascular: Normal rate, regular rhythm. Grossly normal heart sounds.  Good peripheral circulation. Respiratory: Normal respiratory effort.  No retractions. No wheezes, rales or rhonchi. Good air movement.  Musculoskeletal: Ambulatory with steady gait.  Neurologic:  Normal speech and language. No gait instability. Skin:  Skin appears warm, dry and intact. No rash noted. Psychiatric: Mood and affect are normal. Speech and behavior are normal. ___________________________________________   LABS (all labs ordered are listed, but only abnormal results are displayed)  Labs Reviewed  RAPID STREP SCREEN ([facility] ONLY) - Abnormal; Notable for the following components:      Result Value   Streptococcus, Group A Screen (Direct) POSITIVE (*)    All other components within normal limits    PROCEDURES Procedures    INITIAL IMPRESSION / ASSESSMENT AND PLAN / ED COURSE  Pertinent labs & imaging results that were available during my care of the patient were reviewed by me and considered in my medical decision making (see chart for details).  Well-appearing patient.  No acute distress.  Suspect viral upper respiratory infection.  However pharyngeal erythema noted with sore throat, strep evaluated and was positive.  Will treat with oral amoxicillin, PRN Tussionex at night and continue over-the-counter cough and congestion medication during the day.Discussed indication, risks and benefits of medications with patient.  Discussed follow up with Primary care physician this week. Discussed follow up and return parameters including no resolution or any worsening concerns. Patient verbalized understanding and agreed to plan.    ____________________________________________   FINAL CLINICAL IMPRESSION(S) / ED DIAGNOSES  Final diagnoses:  Upper respiratory tract infection, unspecified type  Strep pharyngitis     ED Discharge Orders         Ordered    chlorpheniramine-HYDROcodone (TUSSIONEX PENNKINETIC ER) 10-8 MG/5ML SUER  At bedtime PRN     07/03/18 1147    amoxicillin (AMOXIL) 875 MG tablet  2 times daily     07/03/18 1154           Note: This dictation was prepared with Dragon dictation along with smaller phrase technology. Any transcriptional errors that result from this process are unintentional.         Renford DillsMiller, Genaro Bekker, NP 07/03/18 1850

## 2018-08-17 ENCOUNTER — Telehealth: Payer: Self-pay | Admitting: Obstetrics and Gynecology

## 2018-08-17 DIAGNOSIS — Z3041 Encounter for surveillance of contraceptive pills: Secondary | ICD-10-CM

## 2018-08-17 MED ORDER — DESOGESTREL-ETHINYL ESTRADIOL 0.15-0.02/0.01 MG (21/5) PO TABS: 1.0000 | ORAL_TABLET | Freq: Every day | ORAL | 2 refills | Status: DC

## 2018-08-17 NOTE — Telephone Encounter (Signed)
The patient called and stated that she is down to her last birth control pill and her pharmacy informed her that she no longer has any more refills, The patient is requesting a medication refill. Please advise.

## 2018-08-17 NOTE — Telephone Encounter (Signed)
Spoke with pharmacy and patient has another month at the pharmacy. I have notified patient. I will refill for another months.

## 2018-09-09 ENCOUNTER — Encounter: Payer: Medicaid Other | Admitting: Obstetrics and Gynecology

## 2018-09-14 ENCOUNTER — Encounter: Payer: Medicaid Other | Admitting: Obstetrics and Gynecology

## 2018-09-16 ENCOUNTER — Ambulatory Visit (INDEPENDENT_AMBULATORY_CARE_PROVIDER_SITE_OTHER): Payer: Medicaid Other | Admitting: Obstetrics and Gynecology

## 2018-09-16 ENCOUNTER — Encounter: Payer: Self-pay | Admitting: Obstetrics and Gynecology

## 2018-09-16 ENCOUNTER — Other Ambulatory Visit: Payer: Self-pay

## 2018-09-16 VITALS — BP 131/85 | HR 80 | Wt 212.8 lb

## 2018-09-16 DIAGNOSIS — Z0000 Encounter for general adult medical examination without abnormal findings: Secondary | ICD-10-CM | POA: Diagnosis not present

## 2018-09-16 NOTE — Progress Notes (Signed)
HPI:      [name] is a 28 y.o. [id number] who LMP was Patient's last menstrual period was 09/11/2018.  Subjective:   She presents today for her annual examination.  She has no complaints.  She is taking OCPs successfully having normal regular cycles.  She would like to continue OCPs.    Hx: The following portions of the patient's history were reviewed and updated as appropriate:             She  has a past medical history of Anxiety, Depression, GERD (gastroesophageal reflux disease), History of abnormal cervical Pap smear (08/19/2016), and Migraines. She does not have any pertinent problems on file. She  has a past surgical history that includes Cholecystectomy and Cesarean section (N/A, 03/05/2017). Her family history includes Asthma in her brother; Diabetes in her maternal grandmother; Heart failure in her maternal grandmother; Hyperlipidemia in her mother; Stroke in her maternal grandmother; Thyroid disease in an other family member. She  reports that she quit smoking about 2 years ago. Her smoking use included cigarettes. She smoked 1.00 pack per day. She has never used smokeless tobacco. She reports that she does not drink alcohol or use drugs. She has a current medication list which includes the following prescription(s): desogestrel-ethinyl estradiol. She is allergic to bee venom.       Review of Systems:  Review of Systems  Constitutional: Denied constitutional symptoms, night sweats, recent illness, fatigue, fever, insomnia and weight loss.  Eyes: Denied eye symptoms, eye pain, photophobia, vision change and visual disturbance.  Ears/Nose/Throat/Neck: Denied ear, nose, throat or neck symptoms, hearing loss, nasal discharge, sinus congestion and sore throat.  Cardiovascular: Denied cardiovascular symptoms, arrhythmia, chest pain/pressure, edema, exercise intolerance, orthopnea and palpitations.  Respiratory: Denied pulmonary symptoms, asthma, pleuritic pain, productive  sputum, cough, dyspnea and wheezing.  Gastrointestinal: Denied, gastro-esophageal reflux, melena, nausea and vomiting.  Genitourinary: Denied genitourinary symptoms including symptomatic vaginal discharge, pelvic relaxation issues, and urinary complaints.  Musculoskeletal: Denied musculoskeletal symptoms, stiffness, swelling, muscle weakness and myalgia.  Dermatologic: Denied dermatology symptoms, rash and scar.  Neurologic: Denied neurology symptoms, dizziness, headache, neck pain and syncope.  Psychiatric: Denied psychiatric symptoms, anxiety and depression.  Endocrine: Denied endocrine symptoms including hot flashes and night sweats.   Meds:   Current Outpatient Medications on File Prior to Visit  Medication Sig Dispense Refill  . desogestrel-ethinyl estradiol (MIRCETTE) 0.15-0.02/0.01 MG (21/5) tablet Take 1 tablet by mouth at bedtime. 3 Package 2   No current facility-administered medications on file prior to visit.     Objective:     Vitals:   09/16/18 1102  BP: 131/85  Pulse: 80              Physical examination General NAD, Conversant  HEENT Atraumatic; Op clear with mmm.  Normo-cephalic. Pupils reactive. Anicteric sclerae  Thyroid/Neck Smooth without nodularity or enlargement. Normal ROM.  Neck Supple.  Skin No rashes, lesions or ulceration. Normal palpated skin turgor. No nodularity.  Breasts: No masses or discharge.  Symmetric.  No axillary adenopathy.  Lungs: Clear to auscultation.No rales or wheezes. Normal Respiratory effort, no retractions.  Heart: NSR.  No murmurs or rubs appreciated. No periferal edema  Abdomen: Soft.  Non-tender.  No masses.  No HSM. No hernia  Extremities: Moves all appropriately.  Normal ROM for age. No lymphadenopathy.  Neuro: Oriented to PPT.  Normal mood. Normal affect.     Pelvic:   Vulva: Normal appearance.  No lesions.  Vagina: No lesions  or abnormalities noted.  Support: Normal pelvic support.  Urethra No masses tenderness or  scarring.  Meatus Normal size without lesions or prolapse.  Cervix: Normal appearance.  No lesions.  Anus: Normal exam.  No lesions.  Perineum: Normal exam.  No lesions.        Bimanual   Uterus: Normal size.  Non-tender.  Mobile.  AV.  Adnexae: No masses.  Non-tender to palpation.  Cul-de-sac: Negative for abnormality.      Assessment:    [id number] Patient Active Problem List   Diagnosis Date Noted  . Delivery by cesarean section using transverse incision of lower segment of uterus 03/05/2017  . Family history of cleft lip and palate   . Fetal cleft lip affecting antepartum care of mother, fetus 1   . History of abnormal cervical Pap smear 08/19/2016  . Obesity (BMI 35.0-39.9 without comorbidity) 08/19/2016     1. Encounter for annual physical exam     Normal exam   OCPs for birth control-no issues   Plan:            1.  Basic Screening Recommendations The basic screening recommendations for asymptomatic women were discussed with the patient during her visit.  The age-appropriate recommendations were discussed with her and the rational for the tests reviewed.  When I am informed by the patient that another primary care physician has previously obtained the age-appropriate tests and they are up-to-date, only outstanding tests are ordered and referrals given as necessary.  Abnormal results of tests will be discussed with her when all of her results are completed. Pap due in 2022 2.  Continue OCPs Orders No orders of the defined types were placed in this encounter.   No orders of the defined types were placed in this encounter.       F/U  Return in about 1 year (around 09/16/2019) for Annual Physical.  Elonda Husky, M.D. 09/16/2018 11:29 AM

## 2019-05-24 ENCOUNTER — Other Ambulatory Visit: Payer: Self-pay | Admitting: Obstetrics and Gynecology

## 2019-05-24 DIAGNOSIS — Z3041 Encounter for surveillance of contraceptive pills: Secondary | ICD-10-CM

## 2019-05-24 NOTE — Telephone Encounter (Signed)
Pt called stating [facility] in [place] does not have script and no refills remain and last dose today.

## 2019-09-19 ENCOUNTER — Encounter: Payer: Medicaid Other | Admitting: Obstetrics and Gynecology

## 2019-09-28 ENCOUNTER — Other Ambulatory Visit: Payer: Self-pay

## 2019-09-28 ENCOUNTER — Ambulatory Visit (INDEPENDENT_AMBULATORY_CARE_PROVIDER_SITE_OTHER): Payer: Medicaid Other | Admitting: Obstetrics and Gynecology

## 2019-09-28 ENCOUNTER — Encounter: Payer: Self-pay | Admitting: Obstetrics and Gynecology

## 2019-09-28 VITALS — BP 139/87 | HR 98 | Ht 64.5 in | Wt 204.1 lb

## 2019-09-28 DIAGNOSIS — Z0000 Encounter for general adult medical examination without abnormal findings: Secondary | ICD-10-CM | POA: Diagnosis not present

## 2019-09-28 DIAGNOSIS — Z3041 Encounter for surveillance of contraceptive pills: Secondary | ICD-10-CM | POA: Diagnosis not present

## 2019-09-28 DIAGNOSIS — Z01419 Encounter for gynecological examination (general) (routine) without abnormal findings: Secondary | ICD-10-CM | POA: Diagnosis not present

## 2019-09-28 MED ORDER — DESOGESTREL-ETHINYL ESTRADIOL 0.15-0.02/0.01 MG (21/5) PO TABS: 1.0000 | ORAL_TABLET | Freq: Every day | ORAL | 3 refills | Status: DC

## 2019-09-28 NOTE — Progress Notes (Signed)
HPI:      [name] is a 29 y.o. [id number] who LMP was Patient's last menstrual period was 09/07/2019 (approximate).  Subjective:   She presents today for her annual examination.  She continues to take OCPs without problem.  She is having normal regular cycles and not missing pills.  She would like to continue on OCPs.    Hx: The following portions of the patient's history were reviewed and updated as appropriate:             She  has a past medical history of Anxiety, Depression, GERD (gastroesophageal reflux disease), History of abnormal cervical Pap smear (08/19/2016), and Migraines. She does not have any pertinent problems on file. She  has a past surgical history that includes Cholecystectomy and Cesarean section (N/A, 03/05/2017). Her family history includes Asthma in her brother; Diabetes in her maternal grandmother; Heart failure in her maternal grandmother; Hyperlipidemia in her mother; Stroke in her maternal grandmother; Thyroid disease in an other family member. She  reports that she quit smoking about 3 years ago. Her smoking use included cigarettes. She smoked 1.00 pack per day. She has never used smokeless tobacco. She reports that she does not drink alcohol or use drugs. She has a current medication list which includes the following prescription(s): desogestrel-ethinyl estradiol. She is allergic to bee venom.       Review of Systems:  Review of Systems  Constitutional: Denied constitutional symptoms, night sweats, recent illness, fatigue, fever, insomnia and weight loss.  Eyes: Denied eye symptoms, eye pain, photophobia, vision change and visual disturbance.  Ears/Nose/Throat/Neck: Denied ear, nose, throat or neck symptoms, hearing loss, nasal discharge, sinus congestion and sore throat.  Cardiovascular: Denied cardiovascular symptoms, arrhythmia, chest pain/pressure, edema, exercise intolerance, orthopnea and palpitations.  Respiratory: Denied pulmonary symptoms,  asthma, pleuritic pain, productive sputum, cough, dyspnea and wheezing.  Gastrointestinal: Denied, gastro-esophageal reflux, melena, nausea and vomiting.  Genitourinary: Denied genitourinary symptoms including symptomatic vaginal discharge, pelvic relaxation issues, and urinary complaints.  Musculoskeletal: Denied musculoskeletal symptoms, stiffness, swelling, muscle weakness and myalgia.  Dermatologic: Denied dermatology symptoms, rash and scar.  Neurologic: Denied neurology symptoms, dizziness, headache, neck pain and syncope.  Psychiatric: Denied psychiatric symptoms, anxiety and depression.  Endocrine: Denied endocrine symptoms including hot flashes and night sweats.   Meds:   No current outpatient medications on file prior to visit.   No current facility-administered medications on file prior to visit.    Objective:     Vitals:   09/28/19 0736  BP: 139/87  Pulse: 98              Physical examination General NAD, Conversant  HEENT Atraumatic; Op clear with mmm.  Normo-cephalic. Pupils reactive. Anicteric sclerae  Thyroid/Neck Smooth without nodularity or enlargement. Normal ROM.  Neck Supple.  Skin No rashes, lesions or ulceration. Normal palpated skin turgor. No nodularity.  Breasts: No masses or discharge.  Symmetric.  No axillary adenopathy.  Lungs: Clear to auscultation.No rales or wheezes. Normal Respiratory effort, no retractions.  Heart: NSR.  No murmurs or rubs appreciated. No periferal edema  Abdomen: Soft.  Non-tender.  No masses.  No HSM. No hernia  Extremities: Moves all appropriately.  Normal ROM for age. No lymphadenopathy.  Neuro: Oriented to PPT.  Normal mood. Normal affect.     Pelvic:   Vulva: Normal appearance.  No lesions.  Vagina: No lesions or abnormalities noted.  Support: Normal pelvic support.  Urethra No masses tenderness or scarring.  Meatus Normal  size without lesions or prolapse.  Cervix: Normal appearance.  No lesions.  Anus: Normal exam.   No lesions.  Perineum: Normal exam.  No lesions.        Bimanual   Uterus: Normal size.  Non-tender.  Mobile.  AV.  Adnexae: No masses.  Non-tender to palpation.  Cul-de-sac: Negative for abnormality.      Assessment:    [id number] Patient Active Problem List   Diagnosis Date Noted  . Delivery by cesarean section using transverse incision of lower segment of uterus 03/05/2017  . Family history of cleft lip and palate   . Fetal cleft lip affecting antepartum care of mother, fetus 1   . History of abnormal cervical Pap smear 08/19/2016  . Obesity (BMI 35.0-39.9 without comorbidity) 08/19/2016     1. Well woman exam with routine gynecological exam   2. Oral contraceptive pill surveillance        Plan:            1.  Basic Screening Recommendations The basic screening recommendations for asymptomatic women were discussed with the patient during her visit.  The age-appropriate recommendations were discussed with her and the rational for the tests reviewed.  When I am informed by the patient that another primary care physician has previously obtained the age-appropriate tests and they are up-to-date, only outstanding tests are ordered and referrals given as necessary.  Abnormal results of tests will be discussed with her when all of her results are completed.  Routine preventative health maintenance measures emphasized: Exercise/Diet/Weight control, Tobacco Warnings, Alcohol/Substance use risks and Stress Management Pap due next year 2.  Continue OCPs   Orders No orders of the defined types were placed in this encounter.    Meds ordered this encounter  Medications  . desogestrel-ethinyl estradiol (AZURETTE) 0.15-0.02/0.01 MG (21/5) tablet    Sig: Take 1 tablet by mouth at bedtime.    Dispense:  84 tablet    Refill:  3        F/U  Return in about 1 year (around 09/27/2020) for Annual Physical.  Elonda Husky, M.D. 09/28/2019 8:34 AM

## 2019-10-03 ENCOUNTER — Encounter: Payer: Medicaid Other | Admitting: Obstetrics and Gynecology

## 2020-02-29 ENCOUNTER — Emergency Department
Admission: EM | Admit: 2020-02-29 | Discharge: 2020-02-29 | Disposition: A | Discharge status: Home / Self Care | Payer: Medicaid Other | Attending: Emergency Medicine | Admitting: Emergency Medicine

## 2020-02-29 ENCOUNTER — Other Ambulatory Visit: Payer: Self-pay

## 2020-02-29 ENCOUNTER — Encounter: Payer: Self-pay | Admitting: Physician Assistant

## 2020-02-29 DIAGNOSIS — Z2914 Encounter for prophylactic rabies immune globin: Secondary | ICD-10-CM | POA: Insufficient documentation

## 2020-02-29 DIAGNOSIS — Z203 Contact with and (suspected) exposure to rabies: Secondary | ICD-10-CM | POA: Insufficient documentation

## 2020-02-29 DIAGNOSIS — Z87891 Personal history of nicotine dependence: Secondary | ICD-10-CM | POA: Insufficient documentation

## 2020-02-29 DIAGNOSIS — Z79899 Other long term (current) drug therapy: Secondary | ICD-10-CM | POA: Diagnosis not present

## 2020-02-29 DIAGNOSIS — Z209 Contact with and (suspected) exposure to unspecified communicable disease: Secondary | ICD-10-CM

## 2020-02-29 DIAGNOSIS — Z23 Encounter for immunization: Secondary | ICD-10-CM | POA: Insufficient documentation

## 2020-02-29 MED ORDER — RABIES IMMUNE GLOBULIN 150 UNIT/ML IM INJ: 20.0000 [IU]/kg | INJECTION | Freq: Once | INTRAMUSCULAR | Status: DC

## 2020-02-29 MED ORDER — RABIES VACCINE, PCEC IM SUSR
1.0000 mL | Freq: Once | INTRAMUSCULAR | Status: AC
  Administered 2020-02-29: 1 mL via INTRAMUSCULAR
  Filled 2020-02-29: qty 1

## 2020-02-29 MED ORDER — RABIES IMMUNE GLOBULIN 150 UNIT/ML IM INJ
375.0000 [IU] | INJECTION | Freq: Once | INTRAMUSCULAR | Status: AC
  Administered 2020-02-29: 375 [IU] via INTRAMUSCULAR
  Filled 2020-02-29: qty 4

## 2020-02-29 MED ORDER — RABIES IMMUNE GLOBULIN 150 UNIT/ML IM INJ
1500.0000 [IU] | INJECTION | Freq: Once | INTRAMUSCULAR | Status: AC
  Administered 2020-02-29: 1500 [IU] via INTRAMUSCULAR
  Filled 2020-02-29: qty 10

## 2020-02-29 NOTE — Discharge Instructions (Addendum)
You are being prophylaxed against rabies. You should report to [facility] for the remaining doses based on the below schedule.   Rabies # 2..............03/03/20 Rabies # 3..............03/10/20 Rabies #4 ..............03/17/20

## 2020-02-29 NOTE — ED Triage Notes (Signed)
Pt states a bat flew in her face and into her neck and CDC called her and states she needed to get rabies vaccine

## 2020-02-29 NOTE — ED Notes (Signed)
Pt signed dc paperwork and paperwork sent to records. 

## 2020-02-29 NOTE — ED Notes (Signed)
See triage note  Presents needing rabies vaccine  States she has been exposed to bats at home

## 2020-03-01 NOTE — ED Provider Notes (Signed)
[facility] Emergency Department Provider Note ____________________________________________  Time seen: 1917  I have reviewed the triage vital signs and the nursing notes.  HISTORY  Chief Complaint  Rabies Injection  HPI Alicia Hendricks is a 29 y.o. female presents to the ED at the advice of the health department, after a bat exposure. She reports an ill-appearing bat flying into her apartment, and landing on her head then neck. She denies any actual or suspected bites. She attempted put a bucket over the bat after is fell to the ground. When animal control arrived, the bat had apparently escaped. She presents now for rabies prophylaxis. She is without other complaint.   Past Medical History:  Diagnosis Date  . Anxiety   . Depression   . GERD (gastroesophageal reflux disease)   . History of abnormal cervical Pap smear 08/19/2016  . Migraines    migraines    Patient Active Problem List   Diagnosis Date Noted  . Delivery by cesarean section using transverse incision of lower segment of uterus 03/05/2017  . Family history of cleft lip and palate   . Fetal cleft lip affecting antepartum care of mother, fetus 1   . History of abnormal cervical Pap smear 08/19/2016  . Obesity (BMI 35.0-39.9 without comorbidity) 08/19/2016    Past Surgical History:  Procedure Laterality Date  . CESAREAN SECTION N/A 03/05/2017   Procedure: PRIMARY CESAREAN SECTION;  Surgeon: Linzie Collin, MD;  Location: [facility] ORS;  Service: Obstetrics;  Laterality: N/A;  . CHOLECYSTECTOMY      Prior to Admission medications   Medication Sig Start Date End Date Taking? Authorizing Provider  desogestrel-ethinyl estradiol (AZURETTE) 0.15-0.02/0.01 MG (21/5) tablet Take 1 tablet by mouth at bedtime. 09/28/19   Linzie Collin, MD    Allergies Bee venom  Family History  Problem Relation Age of Onset  . Hyperlipidemia Mother   . Asthma Brother   . Diabetes Maternal Grandmother    . Heart failure Maternal Grandmother   . Stroke Maternal Grandmother   . Thyroid disease Other   . Breast cancer Neg Hx   . Ovarian cancer Neg Hx   . Colon cancer Neg Hx     Social History Social History   Tobacco Use  . Smoking status: Former Smoker    Packs/day: 1.00    Types: Cigarettes    Quit date: 07/06/2016    Years since quitting: 3.6  . Smokeless tobacco: Never Used  Vaping Use  . Vaping Use: Former  Substance Use Topics  . Alcohol use: No  . Drug use: No    Review of Systems  Constitutional: Negative for fever. Eyes: Negative for visual changes. ENT: Negative for sore throat. Cardiovascular: Negative for chest pain. Respiratory: Negative for shortness of breath. Gastrointestinal: Negative for abdominal pain, vomiting and diarrhea. Genitourinary: Negative for dysuria. Musculoskeletal: Negative for back pain. Skin: Negative for rash. Neurological: Negative for headaches, focal weakness or numbness. ____________________________________________  PHYSICAL EXAM:  VITAL SIGNS: ED Triage Vitals  Enc Vitals Group     BP 02/29/20 1740 (!) 163/105     Pulse Rate 02/29/20 1740 100     Resp 02/29/20 1740 18     Temp 02/29/20 1740 99 F (37.2 C)     Temp Source 02/29/20 1740 Oral     SpO2 02/29/20 1740 97 %     Weight 02/29/20 1740 207 lb 12.8 oz (94.3 kg)     Height 02/29/20 1740 5\' 4"  (1.626 m)  Head Circumference --      Peak Flow --      Pain Score 02/29/20 1739 0     Pain Loc --      Pain Edu? --      Excl. in GC? --     Constitutional: Alert and oriented. Well appearing and in no distress. Head: Normocephalic and atraumatic. Eyes: Conjunctivae are normal. Normal extraocular movements Cardiovascular: Normal rate, regular rhythm. Normal distal pulses. Respiratory: Normal respiratory effort. No wheezes/rales/rhonchi. Musculoskeletal: Nontender with normal range of motion in all extremities.  Neurologic:  Normal gait without ataxia. Normal speech  and language. No gross focal neurologic deficits are appreciated. Skin:  Skin is warm, dry and intact. No rash noted. Psychiatric: Mood and affect are normal. Patient exhibits appropriate insight and judgment. ____________________________________________  PROCEDURES  Rabies Vaccine 1 mg IM Rabies IG 1875 units IM  Procedures ____________________________________________  INITIAL IMPRESSION / ASSESSMENT AND PLAN / ED COURSE  Patient with ED evaluation and request for post-exposure rabies prophylaxis after contact with a presumed rabid bat in the home. There is no bite exposure reported. She is given information and agrees to proceed with vaccination. She is given a vaccine completion schedule and will follow-up with [facility] as directed. Return precautions are reviewed.   Tarea A Depaul was evaluated in Emergency Department on 03/01/2020 for the symptoms described in the history of present illness. She was evaluated in the context of the global COVID-19 pandemic, which necessitated consideration that the patient might be at risk for infection with the SARS-CoV-2 virus that causes COVID-19. Institutional protocols and algorithms that pertain to the evaluation of patients at risk for COVID-19 are in a state of rapid change based on information released by regulatory bodies including the CDC and federal and state organizations. These policies and algorithms were followed during the patient's care in the ED. ____________________________________________  FINAL CLINICAL IMPRESSION(S) / ED DIAGNOSES  Final diagnoses:  Exposure to bat without known bite  Need for post exposure prophylaxis for rabies      Lissa Hoard, PA-C 03/01/20 1015    Arnaldo Natal, MD 03/01/20 1620

## 2020-03-03 ENCOUNTER — Other Ambulatory Visit: Payer: Self-pay

## 2020-03-03 ENCOUNTER — Ambulatory Visit
Admission: RE | Admit: 2020-03-03 | Discharge: 2020-03-03 | Disposition: A | Discharge status: Home / Self Care | Payer: Medicaid Other | Source: Ambulatory Visit | Attending: Family Medicine | Admitting: Family Medicine

## 2020-03-03 VITALS — BP 131/97 | HR 91 | Temp 98.3°F | Resp 18 | Ht 64.0 in | Wt 207.9 lb

## 2020-03-03 DIAGNOSIS — Z203 Contact with and (suspected) exposure to rabies: Secondary | ICD-10-CM | POA: Diagnosis not present

## 2020-03-03 MED ORDER — RABIES VACCINE, PCEC IM SUSR
1.0000 mL | Freq: Once | INTRAMUSCULAR | Status: AC
  Administered 2020-03-03: 1 mL via INTRAMUSCULAR

## 2020-03-03 NOTE — ED Triage Notes (Signed)
Patient is here for Day 3 Rabies vaccine. States that she tolerated previous vaccines fine.

## 2020-03-07 ENCOUNTER — Ambulatory Visit
Admission: RE | Admit: 2020-03-07 | Discharge: 2020-03-07 | Disposition: A | Discharge status: Home / Self Care | Payer: Medicaid Other | Source: Ambulatory Visit | Attending: Family Medicine | Admitting: Family Medicine

## 2020-03-07 ENCOUNTER — Other Ambulatory Visit: Payer: Self-pay

## 2020-03-07 VITALS — BP 144/102 | HR 64 | Temp 98.1°F | Resp 18 | Ht 64.0 in | Wt 207.9 lb

## 2020-03-07 DIAGNOSIS — Z203 Contact with and (suspected) exposure to rabies: Secondary | ICD-10-CM | POA: Diagnosis not present

## 2020-03-07 MED ORDER — RABIES VACCINE, PCEC IM SUSR: 1.0000 mL | Freq: Once | INTRAMUSCULAR | Status: DC

## 2020-03-07 MED ORDER — RABIES VACCINE, PCEC IM SUSR
1.0000 mL | Freq: Once | INTRAMUSCULAR | Status: AC
  Administered 2020-03-07: 1 mL via INTRAMUSCULAR

## 2020-03-07 NOTE — ED Triage Notes (Signed)
Patient is here for day 7 rabies vaccine. Patient has tolerated previous vaccines well.

## 2020-03-14 ENCOUNTER — Ambulatory Visit
Admission: RE | Admit: 2020-03-14 | Discharge: 2020-03-14 | Disposition: A | Discharge status: Home / Self Care | Payer: Medicaid Other | Source: Ambulatory Visit | Attending: Physician Assistant | Admitting: Physician Assistant

## 2020-03-14 ENCOUNTER — Other Ambulatory Visit: Payer: Self-pay

## 2020-03-14 DIAGNOSIS — Z203 Contact with and (suspected) exposure to rabies: Secondary | ICD-10-CM

## 2020-03-14 MED ORDER — RABIES VACCINE, PCEC IM SUSR
1.0000 mL | Freq: Once | INTRAMUSCULAR | Status: AC
  Administered 2020-03-14: 1 mL via INTRAMUSCULAR

## 2020-03-14 NOTE — ED Triage Notes (Signed)
Pt present to [facility] for day 14 rabies vaccine. She has done well with the other vaccines.

## 2020-03-28 ENCOUNTER — Encounter: Payer: Self-pay | Admitting: Emergency Medicine

## 2020-03-28 ENCOUNTER — Ambulatory Visit
Admission: EM | Admit: 2020-03-28 | Discharge: 2020-03-28 | Disposition: A | Discharge status: Home / Self Care | Payer: Medicaid Other | Attending: Internal Medicine | Admitting: Internal Medicine

## 2020-03-28 ENCOUNTER — Other Ambulatory Visit: Payer: Self-pay

## 2020-03-28 DIAGNOSIS — E669 Obesity, unspecified: Secondary | ICD-10-CM | POA: Insufficient documentation

## 2020-03-28 DIAGNOSIS — Z79899 Other long term (current) drug therapy: Secondary | ICD-10-CM | POA: Diagnosis not present

## 2020-03-28 DIAGNOSIS — J029 Acute pharyngitis, unspecified: Secondary | ICD-10-CM

## 2020-03-28 DIAGNOSIS — R0683 Snoring: Secondary | ICD-10-CM | POA: Diagnosis not present

## 2020-03-28 DIAGNOSIS — Z87891 Personal history of nicotine dependence: Secondary | ICD-10-CM | POA: Insufficient documentation

## 2020-03-28 DIAGNOSIS — Z6839 Body mass index (BMI) 39.0-39.9, adult: Secondary | ICD-10-CM | POA: Diagnosis not present

## 2020-03-28 DIAGNOSIS — F329 Major depressive disorder, single episode, unspecified: Secondary | ICD-10-CM | POA: Diagnosis not present

## 2020-03-28 DIAGNOSIS — I10 Essential (primary) hypertension: Secondary | ICD-10-CM

## 2020-03-28 DIAGNOSIS — R519 Headache, unspecified: Secondary | ICD-10-CM | POA: Diagnosis present

## 2020-03-28 DIAGNOSIS — F419 Anxiety disorder, unspecified: Secondary | ICD-10-CM | POA: Insufficient documentation

## 2020-03-28 DIAGNOSIS — Z20822 Contact with and (suspected) exposure to covid-19: Secondary | ICD-10-CM | POA: Diagnosis not present

## 2020-03-28 DIAGNOSIS — K219 Gastro-esophageal reflux disease without esophagitis: Secondary | ICD-10-CM | POA: Diagnosis not present

## 2020-03-28 DIAGNOSIS — Z793 Long term (current) use of hormonal contraceptives: Secondary | ICD-10-CM | POA: Insufficient documentation

## 2020-03-28 DIAGNOSIS — R202 Paresthesia of skin: Secondary | ICD-10-CM

## 2020-03-28 DIAGNOSIS — M940 Chondrocostal junction syndrome [Tietze]: Secondary | ICD-10-CM

## 2020-03-28 NOTE — Discharge Instructions (Addendum)
Your neurologic exam is completely normal as well as your EKG. The chest wall tenderness is inflammation and should improve with Advil 600 mg three times a day for 7 days. If you get worse, please go to the ER where they can do more test than we can here.  Talk to your pharmacist about side effects from both medications and call your family doctor to have a follow up.  Monitor your blood pressure and keep a records of it for your family Dr no note. Talk with them about your snoring.  Check your mychart for Covid results, and stay quarantined in the mean time.

## 2020-03-28 NOTE — ED Provider Notes (Signed)
[facility]    CSN: [id number] Arrival date & time: 03/28/20  1840      History   Chief Complaint Chief Complaint  Patient presents with  . Headache  . Tingling    HPI Alicia Hendricks is a 29 y.o. female who presents with onset of HA since today and tingling down her L arm and L leg  x 4 days ago. Has been on Metoprolol q am for HTN and Prozac 10 mg qhs for anxiety. She has not been monitoring her BP since on meds. The tingling is present and may last up to 30 minutes and resolves and comes back. Is located on L shoulder, and L foot, other times her L lower leg, other times on her L forearm. Sometimes her muscles feel tight and sore.  Her HA may be on her R occipital area or forehead. Has been constant all day. Pain level 6/10 and has not taken anything for this.  Has mild soreness on the bridge of her nose, but no rhinitis or stuffiness.  Has been a little tired. Denies change in her appetite.  She has not received the covid injections.  Feels achy all over.  Chest pressure is provoked bu deep breath.  Denies long flights or car rides or had  surgeries.   Past Medical History:  Diagnosis Date  . Anxiety   . Depression   . GERD (gastroesophageal reflux disease)   . History of abnormal cervical Pap smear 08/19/2016  . Migraines    migraines    Patient Active Problem List   Diagnosis Date Noted  . Delivery by cesarean section using transverse incision of lower segment of uterus 03/05/2017  . Family history of cleft lip and palate   . Fetal cleft lip affecting antepartum care of mother, fetus 1   . History of abnormal cervical Pap smear 08/19/2016  . Obesity (BMI 35.0-39.9 without comorbidity) 08/19/2016    Past Surgical History:  Procedure Laterality Date  . CESAREAN SECTION N/A 03/05/2017   Procedure: PRIMARY CESAREAN SECTION;  Surgeon: Linzie Collin, MD;  Location: [facility] ORS;  Service: Obstetrics;  Laterality: N/A;  . CHOLECYSTECTOMY      OB  History    Gravida  2   Para  2   Term  2   Preterm      AB      Living  2     SAB      TAB      Ectopic      Multiple      Live Births  2            Home Medications    Prior to Admission medications   Medication Sig Start Date End Date Taking? Authorizing Provider  desogestrel-ethinyl estradiol (AZURETTE) 0.15-0.02/0.01 MG (21/5) tablet Take 1 tablet by mouth at bedtime. 09/28/19  Yes Linzie Collin, MD  FLUoxetine (PROZAC) 10 MG capsule Take by mouth. 03/08/20 03/08/21 Yes [provider]  metoprolol succinate (TOPROL-XL) 25 MG 24 hr tablet Take by mouth. 03/08/20 03/08/21 Yes [provider]    Family History Family History  Problem Relation Age of Onset  . Hyperlipidemia Mother   . Asthma Brother   . Diabetes Maternal Grandmother   . Heart failure Maternal Grandmother   . Stroke Maternal Grandmother   . Thyroid disease Other   . Breast cancer Neg Hx   . Ovarian cancer Neg Hx   . Colon cancer Neg Hx  Social History Social History   Tobacco Use  . Smoking status: Former Smoker    Packs/day: 1.00    Types: Cigarettes    Quit date: 07/06/2016    Years since quitting: 3.7  . Smokeless tobacco: Never Used  Vaping Use  . Vaping Use: Former  Substance Use Topics  . Alcohol use: No  . Drug use: No     Allergies   Bee venom   Review of Systems Review of Systems  Constitutional: Positive for diaphoresis. Negative for chills and fever.  HENT: Positive for sore throat.        Has mild ST, no nose congestion or rhinitis   Eyes: Negative for discharge.  Respiratory: Positive for shortness of breath. Negative for cough and chest tightness.   Cardiovascular: Positive for chest pain. Negative for leg swelling.       Sometimes her ankles swell, L>R, and resolves by the am or elevates her legs   Gastrointestinal: Negative for abdominal pain, diarrhea, nausea and vomiting.  Endocrine: Positive for heat intolerance.       She does  snore. Never had a sleep apnea.   Musculoskeletal: Positive for myalgias. Negative for arthralgias, back pain, gait problem and joint swelling.  Skin: Negative for rash.  Neurological: Positive for light-headedness. Negative for dizziness and syncope.  Psychiatric/Behavioral: Positive for sleep disturbance.       Sometimes cant get good sleep and does snore     Physical Exam Triage Vital Signs ED Triage Vitals  Enc Vitals Group     BP 03/28/20 1921 (!) 150/91     Pulse Rate 03/28/20 1921 85     Resp 03/28/20 1921 18     Temp 03/28/20 1921 97.9 F (36.6 C)     Temp Source 03/28/20 1921 Oral     SpO2 03/28/20 1921 99 %     Weight 03/28/20 1920 205 lb (93 kg)     Height 03/28/20 1920 5\' 3"  (1.6 m)     Head Circumference --      Peak Flow --      Pain Score 03/28/20 1920 0     Pain Loc --      Pain Edu? --      Excl. in GC? --    No data found.  Updated Vital Signs BP (!) 150/91 (BP Location: Right Arm)   Pulse 85   Temp 97.9 F (36.6 C) (Oral)   Resp 18   Ht 5\' 3"  (1.6 m)   Wt 205 lb (93 kg)   LMP 02/29/2020   SpO2 99%   BMI 36.31 kg/m   Visual Acuity Right Eye Distance:   Left Eye Distance:   Bilateral Distance:    Right Eye Near:   Left Eye Near:    Bilateral Near:     Physical Exam Physical Exam Vitals signs and nursing note reviewed.  Constitutional:      General: He is not in acute distress.    Appearance: He is well-developed and normal weight. He is not ill-appearing, toxic-appearing or diaphoretic.  HENT:     Head: Normocephalic.  Eyes:     Extraocular Movements: Extraocular movements intact.     Pupils: Pupils are equal, round, and reactive to light.  Neck:     Musculoskeletal: Neck supple. No neck rigidity.     Meningeal: Brudzinski's sign absent.  Cardiovascular:     Rate and Rhythm: Normal rate and regular rhythm.     Heart sounds: No murmur. Pulses  are normal.  Pulmonary:     Effort: Pulmonary effort is normal.     Breath sounds:  Normal breath sounds. No wheezing, rhonchi or rales.  Abdominal:     General: Bowel sounds are normal.     Palpations: Abdomen is soft. There is no mass.     Tenderness: There is no abdominal tenderness. There is no guarding.  Musculoskeletal: Normal range of motion.  Lymphadenopathy:     Cervical: No cervical adenopathy.  Skin:    General: Skin is warm and dry.  Neurological:     Mental Status: He is alert.     Cranial Nerves: No cranial nerve deficit or facial asymmetry.     Sensory: No sensory deficit. Has normal sensation to light touch    Motor: No weakness.     Coordination: Romberg sign negative. Coordination normal.     Gait: Gait normal.     Deep Tendon Reflexes: Reflexes normal.     Comments: Normal Romberg, propioception, finger to nose, tandem gait.   Psychiatric:        Mood and Affect: Mood normal.        Speech: Speech normal.        Behavior: Behavior normal.     UC Treatments / Results  Labs (all labs ordered are listed, but only abnormal results are displayed) Labs Reviewed - No data to display  EKG NSR, normal.  Radiology No results found.  Procedures Procedures (including critical care time)  Medications Ordered in UC Medications - No data to display  Initial Impression / Assessment and Plan / UC Course  I have reviewed the triage vital signs and the nursing notes. Has costochondritis and may have Covid due to HA and mild ST I am unsure what is causing the tingling. Her neuro exam is normal.  See instructions  Final Clinical Impressions(s) / UC Diagnoses   Final diagnoses:  None   Discharge Instructions   None    ED Prescriptions    None     PDMP not reviewed this encounter.   Garey Ham, Cordelia Poche 03/28/20 2008

## 2020-03-28 NOTE — ED Triage Notes (Signed)
Patient states she was started on Metoprolol and Fluoxetine about 3 weeks ago. She states that this week she started having a headache and tingling down her left arm. Patient states she has not reached out to her PCP about these symptoms.

## 2020-03-29 LAB — SARS CORONAVIRUS 2 (TAT 6-24 HRS): SARS Coronavirus 2: NEGATIVE

## 2020-03-30 ENCOUNTER — Emergency Department: Payer: Medicaid Other

## 2020-03-30 ENCOUNTER — Other Ambulatory Visit: Payer: Self-pay

## 2020-03-30 ENCOUNTER — Encounter: Payer: Self-pay | Admitting: Emergency Medicine

## 2020-03-30 DIAGNOSIS — M549 Dorsalgia, unspecified: Secondary | ICD-10-CM | POA: Diagnosis not present

## 2020-03-30 DIAGNOSIS — R0789 Other chest pain: Secondary | ICD-10-CM | POA: Insufficient documentation

## 2020-03-30 DIAGNOSIS — R202 Paresthesia of skin: Secondary | ICD-10-CM | POA: Insufficient documentation

## 2020-03-30 DIAGNOSIS — I10 Essential (primary) hypertension: Secondary | ICD-10-CM | POA: Diagnosis not present

## 2020-03-30 DIAGNOSIS — Z79899 Other long term (current) drug therapy: Secondary | ICD-10-CM | POA: Insufficient documentation

## 2020-03-30 DIAGNOSIS — Z87891 Personal history of nicotine dependence: Secondary | ICD-10-CM | POA: Insufficient documentation

## 2020-03-30 LAB — CBC
HCT: 41.7 % (ref 36.0–46.0)
Hemoglobin: 15.3 g/dL — ABNORMAL HIGH (ref 12.0–15.0)
MCH: 28.7 pg (ref 26.0–34.0)
MCHC: 36.7 g/dL — ABNORMAL HIGH (ref 30.0–36.0)
MCV: 78.1 fL — ABNORMAL LOW (ref 80.0–100.0)
Platelets: 410 10*3/uL — ABNORMAL HIGH (ref 150–400)
RBC: 5.34 MIL/uL — ABNORMAL HIGH (ref 3.87–5.11)
RDW: 12.5 % (ref 11.5–15.5)
WBC: 11.6 10*3/uL — ABNORMAL HIGH (ref 4.0–10.5)
nRBC: 0 % (ref 0.0–0.2)

## 2020-03-30 LAB — BASIC METABOLIC PANEL
Anion gap: 12 (ref 5–15)
BUN: 13 mg/dL (ref 6–20)
CO2: 23 mmol/L (ref 22–32)
Calcium: 9.2 mg/dL (ref 8.9–10.3)
Chloride: 105 mmol/L (ref 98–111)
Creatinine, Ser: 0.61 mg/dL (ref 0.44–1.00)
GFR calc Af Amer: >60 mL/min (ref >60)
GFR calc non Af Amer: >60 mL/min (ref >60)
Glucose, Bld: 94 mg/dL (ref 70–99)
Potassium: 3.5 mmol/L (ref 3.5–5.1)
Sodium: 140 mmol/L (ref 135–145)

## 2020-03-30 LAB — TROPONIN I (HIGH SENSITIVITY): Troponin I (High Sensitivity): 5 ng/L (ref <18)

## 2020-03-30 NOTE — ED Triage Notes (Signed)
Patient states that she has had left side chest pressure and tingling to left side of body and right arm times one week. Patient states that she was seen by her PCP yesterday and taken off of her blood pressure medications because her PCP thought that is what was causing the tingling. Patient states that today that chest pain became worse and feels like a stabbing pain.

## 2020-03-31 ENCOUNTER — Emergency Department
Admission: EM | Admit: 2020-03-31 | Discharge: 2020-03-31 | Disposition: A | Discharge status: Home / Self Care | Payer: Medicaid Other | Attending: Emergency Medicine | Admitting: Emergency Medicine

## 2020-03-31 DIAGNOSIS — R079 Chest pain, unspecified: Secondary | ICD-10-CM

## 2020-03-31 DIAGNOSIS — R0789 Other chest pain: Secondary | ICD-10-CM

## 2020-03-31 HISTORY — DX: Essential (primary) hypertension: I10

## 2020-03-31 LAB — FIBRIN DERIVATIVES D-DIMER (ARMC ONLY): Fibrin derivatives D-dimer (ARMC): 323.54 ng/mL (FEU) (ref 0.00–499.00)

## 2020-03-31 LAB — TROPONIN I (HIGH SENSITIVITY): Troponin I (High Sensitivity): 3 ng/L (ref <18)

## 2020-03-31 LAB — POCT PREGNANCY, URINE: Preg Test, Ur: NEGATIVE

## 2020-03-31 MED ORDER — NAPROXEN 500 MG PO TABS: 500.0000 mg | ORAL_TABLET | Freq: Two times a day (BID) | ORAL | 0 refills | Status: DC

## 2020-03-31 MED ORDER — HYDROCODONE-ACETAMINOPHEN 5-325 MG PO TABS
1.0000 | ORAL_TABLET | Freq: Four times a day (QID) | ORAL | 0 refills | Status: DC | PRN
Start: 2020-03-31 — End: 2020-10-14

## 2020-03-31 MED ORDER — KETOROLAC TROMETHAMINE 30 MG/ML IJ SOLN
10.0000 mg | Freq: Once | INTRAMUSCULAR | Status: AC
  Administered 2020-03-31: 9.9 mg via INTRAVENOUS
  Filled 2020-03-31: qty 1

## 2020-03-31 NOTE — ED Notes (Signed)
Signature pad unresponsive 

## 2020-03-31 NOTE — ED Notes (Signed)
Urine sample amber; somewhat cloudy; malodorous. Sent to lab.

## 2020-03-31 NOTE — ED Notes (Addendum)
EDP Sung at bedside assessing pt. Will collect blue tube for d-dimer once done assessing pt. Pt resp reg/unlabored; report tenderness/sharp pain in chest; recent addition of meds; recent anxiety after pt's mother had stroke. Pt reports recent "chills at night" but denies fever. Reports "chest tightness" at the beginning of the week that was intermittent but turned to stabbing pain today.

## 2020-03-31 NOTE — ED Provider Notes (Signed)
[facility] Emergency Department Provider Note   ____________________________________________   First MD Initiated Contact with Patient 03/31/20 0225     (approximate)  I have reviewed the triage vital signs and the nursing notes.   HISTORY  Chief Complaint Chest Pain    HPI Alicia Hendricks is a 29 y.o. female who presents to the ED from home with a chief complaint of chest pain.  Patient has a history of hypertension, anxiety/depression.  Takes metoprolol and Prozac which were both started approximately 1 month ago.  All this week patient has complained of chest tightness associated with alternating tingling to the left and right sides of her body.  She was seen at urgent care with negative Covid test.  Seen by her PCP yesterday and was taken off the metoprolol.  States tingling improved.  Now chest pain feels more like sharp stabbing pain going through to her back.  She is on OCPs.  Denies fever, cough, shortness of breath, abdominal pain, nausea, vomiting or dizziness.  Denies recent travel or trauma.      Past Medical History:  Diagnosis Date  . Anxiety   . Depression   . GERD (gastroesophageal reflux disease)   . History of abnormal cervical Pap smear 08/19/2016  . Hypertension   . Migraines    migraines    Patient Active Problem List   Diagnosis Date Noted  . Delivery by cesarean section using transverse incision of lower segment of uterus 03/05/2017  . Family history of cleft lip and palate   . Fetal cleft lip affecting antepartum care of mother, fetus 1   . History of abnormal cervical Pap smear 08/19/2016  . Obesity (BMI 35.0-39.9 without comorbidity) 08/19/2016    Past Surgical History:  Procedure Laterality Date  . CESAREAN SECTION N/A 03/05/2017   Procedure: PRIMARY CESAREAN SECTION;  Surgeon: Linzie Collin, MD;  Location: [facility] ORS;  Service: Obstetrics;  Laterality: N/A;  . CHOLECYSTECTOMY      Prior to Admission  medications   Medication Sig Start Date End Date Taking? Authorizing Provider  desogestrel-ethinyl estradiol (AZURETTE) 0.15-0.02/0.01 MG (21/5) tablet Take 1 tablet by mouth at bedtime. 09/28/19   Linzie Collin, MD  FLUoxetine (PROZAC) 10 MG capsule Take by mouth. 03/08/20 03/08/21  [provider]  HYDROcodone-acetaminophen (NORCO) 5-325 MG tablet Take 1 tablet by mouth every 6 (six) hours as needed for moderate pain. 03/31/20   Irean Hong, MD  metoprolol succinate (TOPROL-XL) 25 MG 24 hr tablet Take by mouth. 03/08/20 03/08/21  [provider]  naproxen (NAPROSYN) 500 MG tablet Take 1 tablet (500 mg total) by mouth 2 (two) times daily with a meal. 03/31/20   Irean Hong, MD    Allergies Bee venom  Family History  Problem Relation Age of Onset  . Hyperlipidemia Mother   . Asthma Brother   . Diabetes Maternal Grandmother   . Heart failure Maternal Grandmother   . Stroke Maternal Grandmother   . Thyroid disease Other   . Breast cancer Neg Hx   . Ovarian cancer Neg Hx   . Colon cancer Neg Hx     Social History Social History   Tobacco Use  . Smoking status: Former Smoker    Packs/day: 1.00    Types: Cigarettes    Quit date: 07/06/2016    Years since quitting: 3.7  . Smokeless tobacco: Never Used  Vaping Use  . Vaping Use: Former  Substance Use Topics  . Alcohol  use: Yes    Comment: occ  . Drug use: No    Review of Systems  Constitutional: No fever/chills Eyes: No visual changes. ENT: No sore throat. Cardiovascular: Positive for chest pain. Respiratory: Denies shortness of breath. Gastrointestinal: No abdominal pain.  No nausea, no vomiting.  No diarrhea.  No constipation. Genitourinary: Negative for dysuria. Musculoskeletal: Negative for back pain. Skin: Negative for rash. Neurological: Negative for headaches, focal weakness or numbness.   ____________________________________________   PHYSICAL EXAM:  VITAL SIGNS: ED Triage Vitals [03/30/20  1958]  Enc Vitals Group     BP (!) 170/104     Pulse Rate 80     Resp 18     Temp 98.3 F (36.8 C)     Temp Source Oral     SpO2 98 %     Weight 205 lb (93 kg)     Height 5\' 3"  (1.6 m)     Head Circumference      Peak Flow      Pain Score 7     Pain Loc      Pain Edu?      Excl. in GC?     Constitutional: Alert and oriented. Well appearing and in no acute distress. Eyes: Conjunctivae are normal. PERRL. EOMI. Head: Atraumatic. Nose: No congestion/rhinnorhea. Mouth/Throat: Mucous membranes are moist.  Oropharynx non-erythematous. Neck: No stridor.   Cardiovascular: Normal rate, regular rhythm. Grossly normal heart sounds.  Good peripheral circulation to both upper and lower extremities. Respiratory: Normal respiratory effort.  No retractions. Lungs CTAB.  Anterior chest tender to palpation and with movement of trunk. Gastrointestinal: Soft and nontender. No distention. No abdominal bruits. No CVA tenderness. Musculoskeletal: No lower extremity tenderness nor edema.  No joint effusions. Neurologic:  Normal speech and language. No gross focal neurologic deficits are appreciated. No gait instability. Skin:  Skin is warm, dry and intact. No rash noted. Psychiatric: Mood and affect are normal. Speech and behavior are normal.  ____________________________________________   LABS (all labs ordered are listed, but only abnormal results are displayed)  Labs Reviewed  CBC - Abnormal; Notable for the following components:      Result Value   WBC 11.6 (*)    RBC 5.34 (*)    Hemoglobin 15.3 (*)    MCV 78.1 (*)    MCHC 36.7 (*)    Platelets 410 (*)    All other components within normal limits  BASIC METABOLIC PANEL  FIBRIN DERIVATIVES D-DIMER ([facility] ONLY)  POC URINE PREG, ED  POCT PREGNANCY, URINE  TROPONIN I (HIGH SENSITIVITY)  TROPONIN I (HIGH SENSITIVITY)   ____________________________________________  EKG  ED ECG REPORT I, Thayden Lemire J, the attending physician, personally  viewed and interpreted this ECG.   Date: 03/31/2020  EKG Time: 1957  Rate: 75  Rhythm: normal EKG, normal sinus rhythm  Axis: Normal  Intervals:none  ST&T Change: Nonspecific  ____________________________________________  RADIOLOGY  ED MD interpretation: No acute cardiopulmonary process  Official radiology report(s): DG Chest 2 View  Result Date: 03/30/2020 CLINICAL DATA:  Left-sided chest pressure. EXAM: CHEST - 2 VIEW COMPARISON:  None. FINDINGS: The heart size and mediastinal contours are within normal limits. Both lungs are clear. The visualized skeletal structures are unremarkable. IMPRESSION: No active cardiopulmonary disease. Electronically Signed   By: 04/01/2020 M.D.   On: 03/30/2020 20:52    ____________________________________________   PROCEDURES  Procedure(s) performed (including Critical Care):  Procedures   ____________________________________________   INITIAL IMPRESSION / ASSESSMENT AND PLAN /  ED COURSE  As part of my medical decision making, I reviewed the following data within the electronic MEDICAL RECORD NUMBER Nursing notes reviewed and incorporated, Labs reviewed, EKG interpreted, Old chart reviewed, Radiograph reviewed and Notes from prior ED visits        29 year old female presenting with pleuritic chest pain. Differential diagnosis includes, but is not limited to, ACS, aortic dissection, pulmonary embolism, cardiac tamponade, pneumothorax, pneumonia, pericarditis, myocarditis, GI-related causes including esophagitis/gastritis, and musculoskeletal chest wall pain.    2 sets of troponin and EKG, chest x-ray unremarkable.  Will check D-dimer as patient is on OCPs.  IV Toradol for pain and reassess.   Clinical Course as of Apr 01 511  Wynelle Link Mar 31, 2020  1017 Updated patient on negative D-dimer results.  Will discharge home with prescription for NSAIDs, analgesia and she will follow up closely with her PCP.  Strict return precautions given.   Patient verbalizes understanding and agrees with plan of care.   [JS]    Clinical Course User Index [JS] Irean Hong, MD     ____________________________________________   FINAL CLINICAL IMPRESSION(S) / ED DIAGNOSES  Final diagnoses:  Nonspecific chest pain  Chest wall pain     ED Discharge Orders         Ordered    naproxen (NAPROSYN) 500 MG tablet  2 times daily with meals        03/31/20 0419    HYDROcodone-acetaminophen (NORCO) 5-325 MG tablet  Every 6 hours PRN        03/31/20 0419          *Please note:  Alicia Hendricks was evaluated in Emergency Department on 03/31/2020 for the symptoms described in the history of present illness. She was evaluated in the context of the global COVID-19 pandemic, which necessitated consideration that the patient might be at risk for infection with the SARS-CoV-2 virus that causes COVID-19. Institutional protocols and algorithms that pertain to the evaluation of patients at risk for COVID-19 are in a state of rapid change based on information released by regulatory bodies including the CDC and federal and state organizations. These policies and algorithms were followed during the patient's care in the ED.  Some ED evaluations and interventions may be delayed as a result of limited staffing during and the pandemic.*   Note:  This document was prepared using Dragon voice recognition software and may include unintentional dictation errors.   Irean Hong, MD 03/31/20 [phone]

## 2020-03-31 NOTE — ED Notes (Signed)
Blue tube sent to lab 

## 2020-03-31 NOTE — Discharge Instructions (Addendum)
You may take pain medicines as needed (Naprosyn/Norco).  Apply moist heat to affected area several times daily.  Return to the ER for worsening symptoms, persistent vomiting, difficulty breathing or other concerns.

## 2020-04-13 DIAGNOSIS — F419 Anxiety disorder, unspecified: Secondary | ICD-10-CM | POA: Insufficient documentation

## 2020-10-02 ENCOUNTER — Ambulatory Visit (INDEPENDENT_AMBULATORY_CARE_PROVIDER_SITE_OTHER): Payer: Medicaid Other | Admitting: Obstetrics and Gynecology

## 2020-10-02 ENCOUNTER — Other Ambulatory Visit (HOSPITAL_COMMUNITY)
Admission: RE | Admit: 2020-10-02 | Discharge: 2020-10-02 | Disposition: A | Discharge status: Home / Self Care | Payer: Medicaid Other | Source: Ambulatory Visit | Attending: Obstetrics and Gynecology | Admitting: Obstetrics and Gynecology

## 2020-10-02 ENCOUNTER — Encounter: Payer: Self-pay | Admitting: Obstetrics and Gynecology

## 2020-10-02 ENCOUNTER — Other Ambulatory Visit: Payer: Self-pay

## 2020-10-02 VITALS — BP 157/101 | HR 88 | Ht 64.0 in | Wt 210.4 lb

## 2020-10-02 DIAGNOSIS — Z01419 Encounter for gynecological examination (general) (routine) without abnormal findings: Secondary | ICD-10-CM

## 2020-10-02 DIAGNOSIS — Z124 Encounter for screening for malignant neoplasm of cervix: Secondary | ICD-10-CM | POA: Insufficient documentation

## 2020-10-02 DIAGNOSIS — Z3041 Encounter for surveillance of contraceptive pills: Secondary | ICD-10-CM

## 2020-10-02 DIAGNOSIS — Z0000 Encounter for general adult medical examination without abnormal findings: Secondary | ICD-10-CM | POA: Diagnosis not present

## 2020-10-02 MED ORDER — DESOGESTREL-ETHINYL ESTRADIOL 0.15-0.02/0.01 MG (21/5) PO TABS: 1.0000 | ORAL_TABLET | Freq: Every day | ORAL | 3 refills | Status: DC

## 2020-10-02 NOTE — Addendum Note (Signed)
Addended by: Dorian Pod on: 10/02/2020 12:58 PM   Modules accepted: Orders

## 2020-10-02 NOTE — Progress Notes (Signed)
HPI:      [name] is a 30 y.o. [id number] who LMP was Alicia Hendricks's last menstrual period was 09/02/2020 (approximate).  Subjective:   She presents today for her annual examination.  She is taking OCPs and having normal regular cycles.  She would like to continue them.  She has no complaints today. She has received 2 of the 3 current Covid shots she is due for the booster next month.    Hx: The following portions of the Alicia Hendricks's history were reviewed and updated as appropriate:             She  has a past medical history of Anxiety, Depression, GERD (gastroesophageal reflux disease), History of abnormal cervical Pap smear (08/19/2016), Hypertension, and Migraines. She does not have any pertinent problems on file. She  has a past surgical history that includes Cholecystectomy and Cesarean section (N/A, 03/05/2017). Her family history includes Asthma in her brother; Diabetes in her maternal grandmother; Heart failure in her maternal grandmother; Hyperlipidemia in her mother; Stroke in her maternal grandmother; Thyroid disease in an other family member. She  reports that she quit smoking about 4 years ago. Her smoking use included cigarettes. She smoked 1.00 pack per day. She has never used smokeless tobacco. She reports current alcohol use. She reports that she does not use drugs. She has a current medication list which includes the following prescription(s): fluoxetine, desogestrel-ethinyl estradiol, hydrocodone-acetaminophen, metoprolol succinate, and naproxen. She is allergic to bee venom.       Review of Systems:  Review of Systems  Constitutional: Denied constitutional symptoms, night sweats, recent illness, fatigue, fever, insomnia and weight loss.  Eyes: Denied eye symptoms, eye pain, photophobia, vision change and visual disturbance.  Ears/Nose/Throat/Neck: Denied ear, nose, throat or neck symptoms, hearing loss, nasal discharge, sinus congestion and sore throat.   Cardiovascular: Denied cardiovascular symptoms, arrhythmia, chest pain/pressure, edema, exercise intolerance, orthopnea and palpitations.  Respiratory: Denied pulmonary symptoms, asthma, pleuritic pain, productive sputum, cough, dyspnea and wheezing.  Gastrointestinal: Denied, gastro-esophageal reflux, melena, nausea and vomiting.  Genitourinary: Denied genitourinary symptoms including symptomatic vaginal discharge, pelvic relaxation issues, and urinary complaints.  Musculoskeletal: Denied musculoskeletal symptoms, stiffness, swelling, muscle weakness and myalgia.  Dermatologic: Denied dermatology symptoms, rash and scar.  Neurologic: Denied neurology symptoms, dizziness, headache, neck pain and syncope.  Psychiatric: Denied psychiatric symptoms, anxiety and depression.  Endocrine: Denied endocrine symptoms including hot flashes and night sweats.   Meds:   Current Outpatient Medications on File Prior to Visit  Medication Sig Dispense Refill  . FLUoxetine (PROZAC) 10 MG capsule Take by mouth.    Marland Kitchen HYDROcodone-acetaminophen (NORCO) 5-325 MG tablet Take 1 tablet by mouth every 6 (six) hours as needed for moderate pain. (Alicia Hendricks not taking: Reported on 10/02/2020) 15 tablet 0  . metoprolol succinate (TOPROL-XL) 25 MG 24 hr tablet Take by mouth. (Alicia Hendricks not taking: Reported on 10/02/2020)    . naproxen (NAPROSYN) 500 MG tablet Take 1 tablet (500 mg total) by mouth 2 (two) times daily with a meal. (Alicia Hendricks not taking: Reported on 10/02/2020) 14 tablet 0   No current facility-administered medications on file prior to visit.       Upstream - 10/02/20 0808      Pregnancy Intention Screening   Does the Alicia Hendricks want to become pregnant in the next year? No    Does the Alicia Hendricks's partner want to become pregnant in the next year? No    Would the Alicia Hendricks like to discuss contraceptive options today? No  Contraception Wrap Up   Current Method Oral Contraceptive    End Method Oral Contraceptive     Contraception Counseling Provided No          The pregnancy intention screening data noted above was reviewed. Potential methods of contraception were discussed. The Alicia Hendricks elected to proceed with Oral Contraceptive.     Objective:     Vitals:   10/02/20 0803  BP: (!) 157/101  Pulse: 88    Filed Weights   10/02/20 0803  Weight: 210 lb 6.4 oz (95.4 kg)              Physical examination General NAD, Conversant  HEENT Atraumatic; Op clear with mmm.  Normo-cephalic. Pupils reactive. Anicteric sclerae  Thyroid/Neck Smooth without nodularity or enlargement. Normal ROM.  Neck Supple.  Skin No rashes, lesions or ulceration. Normal palpated skin turgor. No nodularity.  Breasts: No masses or discharge.  Symmetric.  No axillary adenopathy.  Lungs: Clear to auscultation.No rales or wheezes. Normal Respiratory effort, no retractions.  Heart: NSR.  No murmurs or rubs appreciated. No periferal edema  Abdomen: Soft.  Non-tender.  No masses.  No HSM. No hernia  Extremities: Moves all appropriately.  Normal ROM for age. No lymphadenopathy.  Neuro: Oriented to PPT.  Normal mood. Normal affect.     Pelvic:   Vulva: Normal appearance.  No lesions.  Vagina: No lesions or abnormalities noted.  Support: Normal pelvic support.  Urethra No masses tenderness or scarring.  Meatus Normal size without lesions or prolapse.  Cervix: Normal appearance.  No lesions.  Anus: Normal exam.  No lesions.  Perineum: Normal exam.  No lesions.        Bimanual   Uterus: Normal size.  Non-tender.  Mobile.  AV.  Adnexae: No masses.  Non-tender to palpation.  Cul-de-sac: Negative for abnormality.      Assessment:    [id number] Alicia Hendricks Active Problem List   Diagnosis Date Noted  . Delivery by cesarean section using transverse incision of lower segment of uterus 03/05/2017  . Family history of cleft lip and palate   . Fetal cleft lip affecting antepartum care of mother, fetus 1   . History of abnormal  cervical Pap smear 08/19/2016  . Obesity (BMI 35.0-39.9 without comorbidity) 08/19/2016     1. Well woman exam with routine gynecological exam   2. Oral contraceptive pill surveillance        Plan:            1.  Basic Screening Recommendations The basic screening recommendations for asymptomatic women were discussed with the Alicia Hendricks during her visit.  The age-appropriate recommendations were discussed with her and the rational for the tests reviewed.  When I am informed by the Alicia Hendricks that another primary care physician has previously obtained the age-appropriate tests and they are up-to-date, only outstanding tests are ordered and referrals given as necessary.  Abnormal results of tests will be discussed with her when all of her results are completed.  Routine preventative health maintenance measures emphasized: Exercise/Diet/Weight control, Tobacco Warnings, Alcohol/Substance use risks and Stress Management Pap performed Orders No orders of the defined types were placed in this encounter.    Meds ordered this encounter  Medications  . desogestrel-ethinyl estradiol (AZURETTE) 0.15-0.02/0.01 MG (21/5) tablet    Sig: Take 1 tablet by mouth at bedtime.    Dispense:  84 tablet    Refill:  3          F/U  Return in about  1 year (around 10/02/2021) for Annual Physical.  Elonda Husky, M.D. 10/02/2020 8:25 AM

## 2020-10-04 LAB — CYTOLOGY - PAP
Adequacy: ABSENT
Comment: NEGATIVE
Diagnosis: NEGATIVE
High risk HPV: NEGATIVE

## 2020-10-14 ENCOUNTER — Ambulatory Visit
Admission: EM | Admit: 2020-10-14 | Discharge: 2020-10-14 | Disposition: A | Discharge status: Home / Self Care | Payer: Medicaid Other | Attending: Internal Medicine | Admitting: Internal Medicine

## 2020-10-14 ENCOUNTER — Encounter: Payer: Self-pay | Admitting: Emergency Medicine

## 2020-10-14 ENCOUNTER — Other Ambulatory Visit: Payer: Self-pay

## 2020-10-14 ENCOUNTER — Ambulatory Visit: Payer: Self-pay

## 2020-10-14 DIAGNOSIS — M436 Torticollis: Secondary | ICD-10-CM | POA: Diagnosis not present

## 2020-10-14 DIAGNOSIS — M62838 Other muscle spasm: Secondary | ICD-10-CM

## 2020-10-14 MED ORDER — CYCLOBENZAPRINE HCL 10 MG PO TABS: 10.0000 mg | ORAL_TABLET | Freq: Three times a day (TID) | ORAL | 0 refills | Status: DC

## 2020-10-14 NOTE — ED Triage Notes (Signed)
Patient c/o right side neck pain and shoulder pain that started last Thursday.

## 2020-10-14 NOTE — ED Provider Notes (Signed)
[facility]    CSN: [id number] Arrival date & time: 10/14/20  7425      History   Chief Complaint Chief Complaint  Patient presents with  . Neck Pain       . Shoulder Pain    HPI Alicia Hendricks is a 30 y.o. female who presents with L neck and shoulder pain x 4 days. The day this started she was not lifting any trays and turned her head to the R and felt the pain on her R trap area and since then has stiffness to turn her head to the R with pain. Denies past hx of injuring her neck or shoulder before. Has been taking Ibuprofen and Tylenol and has used heat and ice as well, but is not getting better.    Past Medical History:  Diagnosis Date  . Anxiety   . Depression   . GERD (gastroesophageal reflux disease)   . History of abnormal cervical Pap smear 08/19/2016  . Hypertension   . Migraines    migraines    Patient Active Problem List   Diagnosis Date Noted  . Delivery by cesarean section using transverse incision of lower segment of uterus 03/05/2017  . Family history of cleft lip and palate   . Fetal cleft lip affecting antepartum care of mother, fetus 1   . History of abnormal cervical Pap smear 08/19/2016  . Obesity (BMI 35.0-39.9 without comorbidity) 08/19/2016    Past Surgical History:  Procedure Laterality Date  . CESAREAN SECTION N/A 03/05/2017   Procedure: PRIMARY CESAREAN SECTION;  Surgeon: Linzie Collin, MD;  Location: [facility] ORS;  Service: Obstetrics;  Laterality: N/A;  . CHOLECYSTECTOMY      OB History    Gravida  2   Para  2   Term  2   Preterm      AB      Living  2     SAB      IAB      Ectopic      Multiple      Live Births  2            Home Medications    Prior to Admission medications   Medication Sig Start Date End Date Taking? Authorizing Provider  cyclobenzaprine (FLEXERIL) 10 MG tablet Take 1 tablet (10 mg total) by mouth 3 (three) times daily. 10/14/20  Yes Rodriguez-Southworth, Nettie Elm, PA-C   desogestrel-ethinyl estradiol (AZURETTE) 0.15-0.02/0.01 MG (21/5) tablet Take 1 tablet by mouth at bedtime. 10/02/20  Yes Linzie Collin, MD  FLUoxetine (PROZAC) 10 MG capsule Take by mouth. 03/08/20 03/08/21 Yes [provider]  metoprolol succinate (TOPROL-XL) 25 MG 24 hr tablet Take by mouth. Patient not taking: No sig reported 03/08/20 10/14/20  [provider]    Family History Family History  Problem Relation Age of Onset  . Hyperlipidemia Mother   . Asthma Brother   . Diabetes Maternal Grandmother   . Heart failure Maternal Grandmother   . Stroke Maternal Grandmother   . Thyroid disease Other   . Breast cancer Neg Hx   . Ovarian cancer Neg Hx   . Colon cancer Neg Hx     Social History Social History   Tobacco Use  . Smoking status: Former Smoker    Packs/day: 1.00    Types: Cigarettes    Quit date: 07/06/2016    Years since quitting: 4.2  . Smokeless tobacco: Never Used  Vaping Use  . Vaping Use: Former  Substance Use Topics  . Alcohol use: Yes    Comment: occ  . Drug use: No     Allergies   Bee venom   Review of Systems Review of Systems  Constitutional: Negative for fever.  HENT: Negative for congestion.   Respiratory: Negative for cough.   Musculoskeletal: Positive for neck pain and neck stiffness. Negative for gait problem.  Skin: Negative for rash.  Neurological: Negative for numbness.     Physical Exam Triage Vital Signs ED Triage Vitals  Enc Vitals Group     BP 10/14/20 0853 (!) 164/100     Pulse Rate 10/14/20 0853 95     Resp 10/14/20 0853 18     Temp 10/14/20 0853 98.2 F (36.8 C)     Temp Source 10/14/20 0853 Oral     SpO2 10/14/20 0853 99 %     Weight 10/14/20 0851 210 lb 5.1 oz (95.4 kg)     Height 10/14/20 0851 5\' 4"  (1.626 m)     Head Circumference --      Peak Flow --      Pain Score 10/14/20 0850 6     Pain Loc --      Pain Edu? --      Excl. in GC? --    No data found.  Updated Vital Signs BP (!)  164/100 (BP Location: Left Arm)   Pulse 95   Temp 98.2 F (36.8 C) (Oral)   Resp 18   Ht 5\' 4"  (1.626 m)   Wt 210 lb 5.1 oz (95.4 kg)   LMP 10/06/2020   SpO2 99%   BMI 36.10 kg/m   Visual Acuity Right Eye Distance:   Left Eye Distance:   Bilateral Distance:    Right Eye Near:   Left Eye Near:    Bilateral Near:     Physical Exam Constitutional:      General: She is not in acute distress.    Appearance: Normal appearance. She is obese. She is not toxic-appearing.  HENT:     Head: Atraumatic.     Right Ear: External ear normal.     Left Ear: External ear normal.  Eyes:     General: No scleral icterus.    Conjunctiva/sclera: Conjunctivae normal.  Neck:     Comments: Has slight tenderness on R upper sternocleidomastoid muscle, and more so on R mid trap with tension. Has decreased rotation to the R, anterior flexion and R lateral flexion, due to pain and stiffness.  Cardiovascular:     Comments: Has white coat HTN Pulmonary:     Effort: Pulmonary effort is normal.  Musculoskeletal:        General: Normal range of motion.  Skin:    General: Skin is warm and dry.  Neurological:     Mental Status: She is alert and oriented to person, place, and time.     Motor: No weakness.     Deep Tendon Reflexes: Reflexes normal.  Psychiatric:        Mood and Affect: Mood normal.        Behavior: Behavior normal.        Thought Content: Thought content normal.        Judgment: Judgment normal.      UC Treatments / Results  Labs (all labs ordered are listed, but only abnormal results are displayed) Labs Reviewed - No data to display  EKG   Radiology No results found.  Procedures Procedures (including critical care time)  Medications Ordered in UC Medications - No data to display  Initial Impression / Assessment and Plan / UC Course  I have reviewed the triage vital signs and the nursing notes. Has muscle spasm of trapezius causing the pain and stiffness with no  neurological findings.  I placed her on Flexeril and advised to d/c the Ibuprofen due to BP elevation and only take Tylenol Is to do bp diaries and if >140/90 x 3 needs to FU with PCP  Final Clinical Impressions(s) / UC Diagnoses   Final diagnoses:  Torticollis, acute  Trapezius muscle spasm   Discharge Instructions   None    ED Prescriptions    Medication Sig Dispense Auth. Provider   cyclobenzaprine (FLEXERIL) 10 MG tablet Take 1 tablet (10 mg total) by mouth 3 (three) times daily. 30 tablet Rodriguez-Southworth, Nettie Elm, PA-C     PDMP not reviewed this encounter.   Garey Ham, PA-C 10/14/20 1125

## 2020-10-25 DIAGNOSIS — I10 Essential (primary) hypertension: Secondary | ICD-10-CM | POA: Insufficient documentation

## 2021-02-05 ENCOUNTER — Ambulatory Visit
Admission: RE | Admit: 2021-02-05 | Discharge: 2021-02-05 | Disposition: A | Discharge status: Home / Self Care | Payer: Medicaid Other | Source: Ambulatory Visit | Attending: Sports Medicine | Admitting: Sports Medicine

## 2021-02-05 ENCOUNTER — Other Ambulatory Visit: Payer: Self-pay

## 2021-02-05 VITALS — BP 177/99 | HR 113 | Temp 99.4°F | Resp 16 | Ht 64.0 in | Wt 190.0 lb

## 2021-02-05 DIAGNOSIS — J039 Acute tonsillitis, unspecified: Secondary | ICD-10-CM

## 2021-02-05 DIAGNOSIS — U071 COVID-19: Secondary | ICD-10-CM | POA: Insufficient documentation

## 2021-02-05 DIAGNOSIS — J029 Acute pharyngitis, unspecified: Secondary | ICD-10-CM | POA: Diagnosis present

## 2021-02-05 DIAGNOSIS — R509 Fever, unspecified: Secondary | ICD-10-CM | POA: Insufficient documentation

## 2021-02-05 LAB — GROUP A STREP BY PCR: Group A Strep by PCR: NOT DETECTED

## 2021-02-05 LAB — RESP PANEL BY RT-PCR (FLU A&B, COVID) ARPGX2
Influenza A by PCR: NEGATIVE
Influenza B by PCR: NEGATIVE
SARS Coronavirus 2 by RT PCR: POSITIVE — AB

## 2021-02-05 MED ORDER — MOLNUPIRAVIR EUA 200MG CAPSULE: 4.0000 | ORAL_CAPSULE | Freq: Two times a day (BID) | ORAL | 0 refills | Status: AC

## 2021-02-05 NOTE — Discharge Instructions (Addendum)
As we discussed, your strep test was negative. Your respiratory panel showed that you are positive for COVID.  Negative for influenza Please see educational handouts. Plenty of rest, plenty fluids, Tylenol or Motrin for any fever or discomfort. Per current CDC guidelines you need to quarantine for 5 days.  Today is day 0.  I gave you a work note that you can return to work on 9 August if you are asymptomatic and you have had no fever for least 24 hours off of all fever reducing medicines. Use throat lozenges, hot teas for your sore throat. Given that you do not have any other symptoms, I will only prescribe you the oral antiviral COVID medication that has been approved by the FDA.  Given your medical conditions you would be a good candidate for this.  Please take it as directed. If your symptoms persist please see your primary care provider. Your symptoms worsen then please go to the ER.

## 2021-02-05 NOTE — ED Triage Notes (Addendum)
Patient complains of sore throat and feverish feeling since this morning. States that she had a covid test done this morning and was negativ but thinks this may have been an antigen test.

## 2021-02-05 NOTE — ED Provider Notes (Signed)
[facility]    CSN: [id number] Arrival date & time: 02/05/21  1354      History   Chief Complaint Chief Complaint  Patient presents with   Sore Throat    HPI Alicia Hendricks is a 30 y.o. female.   30 year old female who presents for evaluation of cute onset of sore throat and fever that began this morning.  She gets primary needs met by [facility].  They can see her today.  She said her temperature was 100.3 this morning.  Her throat is quite sore and she is looked in her mouth and she says her tonsils are big as well.  No nausea vomiting diarrhea.  No abdominal pain or urinary symptoms.  She denies any COVID exposure or influenza or strep exposure.  She took an over-the-counter COVID test this morning that was an antigen test that was negative.  She comes in today hoping to get a PCR test. She denies any chest pain or shortness of breath.  No red flag signs or symptoms elicited on history.   Past Medical History:  Diagnosis Date   Anxiety    Depression    GERD (gastroesophageal reflux disease)    History of abnormal cervical Pap smear 08/19/2016   Hypertension    Migraines    migraines    Patient Active Problem List   Diagnosis Date Noted   Delivery by cesarean section using transverse incision of lower segment of uterus 03/05/2017   Family history of cleft lip and palate    Fetal cleft lip affecting antepartum care of mother, fetus 1    History of abnormal cervical Pap smear 08/19/2016   Obesity (BMI 35.0-39.9 without comorbidity) 08/19/2016    Past Surgical History:  Procedure Laterality Date   CESAREAN SECTION N/A 03/05/2017   Procedure: PRIMARY CESAREAN SECTION;  Surgeon: Harlin Heys, MD;  Location: [facility] ORS;  Service: Obstetrics;  Laterality: N/A;   CHOLECYSTECTOMY      OB History     Gravida  2   Para  2   Term  2   Preterm      AB      Living  2      SAB      IAB      Ectopic      Multiple      Live Births   2            Home Medications    Prior to Admission medications   Medication Sig Start Date End Date Taking? Authorizing Provider  desogestrel-ethinyl estradiol (AZURETTE) 0.15-0.02/0.01 MG (21/5) tablet Take 1 tablet by mouth at bedtime. 10/02/20  Yes Harlin Heys, MD  FLUoxetine (PROZAC) 10 MG capsule Take by mouth. 03/08/20 03/08/21 Yes [provider]  molnupiravir EUA 200 mg CAPS Take 4 capsules (800 mg total) by mouth 2 (two) times daily for 5 days. 02/05/21 02/10/21 Yes Verda Cumins, MD  cyclobenzaprine (FLEXERIL) 10 MG tablet Take 1 tablet (10 mg total) by mouth 3 (three) times daily. 10/14/20   Rodriguez-Southworth, Sunday Spillers, PA-C  metoprolol succinate (TOPROL-XL) 25 MG 24 hr tablet Take by mouth. Patient not taking: No sig reported 03/08/20 10/14/20  [provider]    Family History Family History  Problem Relation Age of Onset   Hyperlipidemia Mother    Asthma Brother    Diabetes Maternal Grandmother    Heart failure Maternal Grandmother    Stroke Maternal Grandmother    Thyroid disease Other  Breast cancer Neg Hx    Ovarian cancer Neg Hx    Colon cancer Neg Hx     Social History Social History   Tobacco Use   Smoking status: Former    Packs/day: 1.00    Types: Cigarettes    Quit date: 07/06/2016    Years since quitting: 4.5   Smokeless tobacco: Never  Vaping Use   Vaping Use: Former  Substance Use Topics   Alcohol use: Yes    Comment: occ   Drug use: No     Allergies   Bee venom   Review of Systems Review of Systems  Constitutional:  Positive for fever. Negative for activity change, appetite change, chills, diaphoresis and fatigue.  HENT:  Positive for ear pain and sore throat. Negative for congestion, ear discharge, postnasal drip, rhinorrhea, sinus pressure, sinus pain and sneezing.   Eyes:  Negative for pain.  Respiratory:  Negative for cough, chest tightness and shortness of breath.   Cardiovascular:  Negative for chest pain  and palpitations.  Gastrointestinal:  Negative for abdominal pain, diarrhea, nausea and vomiting.  Genitourinary:  Negative for dysuria.  Musculoskeletal:  Negative for back pain, myalgias and neck pain.  Skin:  Negative for color change, pallor, rash and wound.  Neurological:  Negative for dizziness, syncope, light-headedness, numbness and headaches.  All other systems reviewed and are negative.   Physical Exam Triage Vital Signs ED Triage Vitals  Enc Vitals Group     BP 02/05/21 1417 (!) 177/99     Pulse Rate 02/05/21 1417 (!) 113     Resp 02/05/21 1417 16     Temp 02/05/21 1417 99.4 F (37.4 C)     Temp Source 02/05/21 1417 Oral     SpO2 02/05/21 1417 100 %     Weight 02/05/21 1416 190 lb (86.2 kg)     Height 02/05/21 1416 '5\' 4"'  (1.626 m)     Head Circumference --      Peak Flow --      Pain Score 02/05/21 1416 8     Pain Loc --      Pain Edu? --      Excl. in [place]? --    No data found.  Updated Vital Signs BP (!) 177/99 (BP Location: Left Arm)   Pulse (!) 113   Temp 99.4 F (37.4 C) (Oral)   Resp 16   Ht '5\' 4"'  (1.626 m)   Wt 86.2 kg   LMP 01/28/2021   SpO2 100%   BMI 32.61 kg/m   Visual Acuity Right Eye Distance:   Left Eye Distance:   Bilateral Distance:    Right Eye Near:   Left Eye Near:    Bilateral Near:     Physical Exam Vitals and nursing note reviewed.  Constitutional:      General: She is not in acute distress.    Appearance: Normal appearance. She is well-developed. She is not ill-appearing, toxic-appearing or diaphoretic.  HENT:     Head: Normocephalic and atraumatic.     Right Ear: Tympanic membrane normal.     Left Ear: Tympanic membrane normal.     Nose: Nose normal. No congestion or rhinorrhea.     Mouth/Throat:     Mouth: Mucous membranes are moist.     Pharynx: Uvula midline. Posterior oropharyngeal erythema present. No pharyngeal swelling, oropharyngeal exudate or uvula swelling.     Tonsils: No tonsillar exudate or tonsillar  abscesses. 2+ on the right. 2+ on the left.  Eyes:     General: No scleral icterus.       Right eye: No discharge.        Left eye: No discharge.     Extraocular Movements: Extraocular movements intact.     Right eye: Normal extraocular motion.     Left eye: Normal extraocular motion.     Conjunctiva/sclera: Conjunctivae normal.     Pupils: Pupils are equal, round, and reactive to light.  Cardiovascular:     Rate and Rhythm: Normal rate and regular rhythm.     Pulses: Normal pulses.     Heart sounds: Normal heart sounds. No murmur heard.   No friction rub. No gallop.  Pulmonary:     Effort: Pulmonary effort is normal.     Breath sounds: Normal breath sounds. No stridor. No wheezing, rhonchi or rales.  Musculoskeletal:     Cervical back: Normal range of motion and neck supple.  Skin:    General: Skin is warm and dry.     Capillary Refill: Capillary refill takes less than 2 seconds.     Findings: No erythema or rash.  Neurological:     General: No focal deficit present.     Mental Status: She is alert and oriented to person, place, and time.     UC Treatments / Results  Labs (all labs ordered are listed, but only abnormal results are displayed) Labs Reviewed  RESP PANEL BY RT-PCR (FLU A&B, COVID) ARPGX2 - Abnormal; Notable for the following components:      Result Value   SARS Coronavirus 2 by RT PCR POSITIVE (*)    All other components within normal limits  GROUP A STREP BY PCR    EKG   Radiology No results found.  Procedures Procedures (including critical care time)  Medications Ordered in UC Medications - No data to display  Initial Impression / Assessment and Plan / UC Course  I have reviewed the triage vital signs and the nursing notes.  Pertinent labs & imaging results that were available during my care of the patient were reviewed by me and considered in my medical decision making (see chart for details).  Clinical impression: 1.  Pharyngitis 2.   Febrile illness 3.  Tonsillitis 4.  Viral illness, possible COVID or influenza  Treatment plan: 1.  The findings and treatment plan were discussed in detail with the patient.  Patient was in agreement. 2.  Recommended getting a strep test.  It was negative. 3.  Recommended getting a respiratory panel and was negative for influenza, positive for COVID. 4.  Provided educational handouts. 5.  Plenty of rest, plenty fluids, Tylenol or Motrin for any fever or discomfort. 6.  Given her symptoms and comorbidities I went ahead and gave her oral antiviral COVID medication.  Send it to her pharmacy. 7.  Want her to quarantine for at least 5 days per current CDC guidelines. 8.  If symptoms persist she needs to go to her primary care provider. 9.  If symptoms worsen then she needs to go to the ER. 10.  She was discharged in stable condition and will follow-up here as needed.    Final Clinical Impressions(s) / UC Diagnoses   Final diagnoses:  Acute pharyngitis, unspecified etiology  Tonsillitis  Febrile illness, acute  COVID-19     Discharge Instructions      As we discussed, your strep test was negative. Your respiratory panel showed that you are positive for COVID.  Negative for influenza Please see  educational handouts. Plenty of rest, plenty fluids, Tylenol or Motrin for any fever or discomfort. Per current CDC guidelines you need to quarantine for 5 days.  Today is day 0.  I gave you a work note that you can return to work on 9 August if you are asymptomatic and you have had no fever for least 24 hours off of all fever reducing medicines. Use throat lozenges, hot teas for your sore throat. Given that you do not have any other symptoms, I will only prescribe you the oral antiviral COVID medication that has been approved by the FDA.  Given your medical conditions you would be a good candidate for this.  Please take it as directed. If your symptoms persist please see your primary care  provider. Your symptoms worsen then please go to the ER.     ED Prescriptions     Medication Sig Dispense Auth. Provider   molnupiravir EUA 200 mg CAPS Take 4 capsules (800 mg total) by mouth 2 (two) times daily for 5 days. 40 capsule Verda Cumins, MD      PDMP not reviewed this encounter.   Verda Cumins, MD 02/05/21 2028

## 2021-02-06 ENCOUNTER — Ambulatory Visit: Payer: Self-pay

## 2021-07-31 ENCOUNTER — Emergency Department
Admission: EM | Admit: 2021-07-31 | Discharge: 2021-07-31 | Disposition: A | Discharge status: Home / Self Care | Payer: Medicaid Other | Attending: Emergency Medicine | Admitting: Emergency Medicine

## 2021-07-31 ENCOUNTER — Other Ambulatory Visit: Payer: Self-pay

## 2021-07-31 DIAGNOSIS — I10 Essential (primary) hypertension: Secondary | ICD-10-CM | POA: Insufficient documentation

## 2021-07-31 DIAGNOSIS — R519 Headache, unspecified: Secondary | ICD-10-CM | POA: Diagnosis present

## 2021-07-31 DIAGNOSIS — G43001 Migraine without aura, not intractable, with status migrainosus: Secondary | ICD-10-CM | POA: Insufficient documentation

## 2021-07-31 LAB — URINALYSIS, ROUTINE W REFLEX MICROSCOPIC
Bilirubin Urine: NEGATIVE
Glucose, UA: NEGATIVE mg/dL
Hgb urine dipstick: NEGATIVE
Ketones, ur: NEGATIVE mg/dL
Leukocytes,Ua: NEGATIVE
Nitrite: NEGATIVE
Protein, ur: NEGATIVE mg/dL
Specific Gravity, Urine: 1.015 (ref 1.005–1.030)
pH: 7 (ref 5.0–8.0)

## 2021-07-31 LAB — BASIC METABOLIC PANEL
Anion gap: 12 (ref 5–15)
BUN: 11 mg/dL (ref 6–20)
CO2: 21 mmol/L — ABNORMAL LOW (ref 22–32)
Calcium: 9.3 mg/dL (ref 8.9–10.3)
Chloride: 105 mmol/L (ref 98–111)
Creatinine, Ser: 0.7 mg/dL (ref 0.44–1.00)
GFR, Estimated: >60 mL/min (ref >60)
Glucose, Bld: 95 mg/dL (ref 70–99)
Potassium: 3.6 mmol/L (ref 3.5–5.1)
Sodium: 138 mmol/L (ref 135–145)

## 2021-07-31 LAB — CBC
HCT: 42.1 % (ref 36.0–46.0)
Hemoglobin: 14.5 g/dL (ref 12.0–15.0)
MCH: 28.3 pg (ref 26.0–34.0)
MCHC: 34.4 g/dL (ref 30.0–36.0)
MCV: 82.1 fL (ref 80.0–100.0)
Platelets: 441 10*3/uL — ABNORMAL HIGH (ref 150–400)
RBC: 5.13 MIL/uL — ABNORMAL HIGH (ref 3.87–5.11)
RDW: 12.5 % (ref 11.5–15.5)
WBC: 10.8 10*3/uL — ABNORMAL HIGH (ref 4.0–10.5)
nRBC: 0 % (ref 0.0–0.2)

## 2021-07-31 LAB — POC URINE PREG, ED: Preg Test, Ur: NEGATIVE

## 2021-07-31 LAB — TROPONIN I (HIGH SENSITIVITY): Troponin I (High Sensitivity): 3 ng/L (ref <18)

## 2021-07-31 MED ORDER — BUTALBITAL-APAP-CAFFEINE 50-325-40 MG PO TABS: 1.0000 | ORAL_TABLET | Freq: Four times a day (QID) | ORAL | 0 refills | Status: AC | PRN

## 2021-07-31 MED ORDER — METOCLOPRAMIDE HCL 5 MG/ML IJ SOLN
10.0000 mg | Freq: Once | INTRAMUSCULAR | Status: AC
  Administered 2021-07-31: 10 mg via INTRAVENOUS
  Filled 2021-07-31: qty 2

## 2021-07-31 MED ORDER — KETOROLAC TROMETHAMINE 30 MG/ML IJ SOLN
30.0000 mg | Freq: Once | INTRAMUSCULAR | Status: AC
  Administered 2021-07-31: 30 mg via INTRAVENOUS
  Filled 2021-07-31: qty 1

## 2021-07-31 MED ORDER — DEXAMETHASONE SODIUM PHOSPHATE 10 MG/ML IJ SOLN
10.0000 mg | Freq: Once | INTRAMUSCULAR | Status: AC
  Administered 2021-07-31: 10 mg via INTRAVENOUS
  Filled 2021-07-31: qty 1

## 2021-07-31 MED ORDER — SODIUM CHLORIDE 0.9 % IV BOLUS
1000.0000 mL | Freq: Once | INTRAVENOUS | Status: AC
  Administered 2021-07-31: 1000 mL via INTRAVENOUS

## 2021-07-31 NOTE — ED Triage Notes (Signed)
Pt presents to ER c/o headache that started around 1200 today.  Pt states she does not have dx hx of migraines but deals with them every few months. Pt has taken 3 excedriun at home without relief.  Pt states she also checked BP at home and it was around 170 systolic.  Pt A&O x4 at this time.  No other neurological symptoms noted.

## 2021-07-31 NOTE — ED Provider Notes (Signed)
[facility] Provider Note    Event Date/Time   First MD Initiated Contact with Patient 07/31/21 2128     (approximate)   History   Headache and Hypertension   HPI Alicia Hendricks is a 31 y.o. female with a stated past medical history of migraines who presents for headache over the past 3 days that has not improved.  Patient describes 6/10 frontal headache that is worsened with bright lights or loud noises and partially relieved with Excedrin and being in a cool dark place or after sleeping.  Patient is also concerned that she took her blood pressure at home today and found her systolics to be in the [id number].  Patient denies any vision changes, tinnitus, weakness/numbness/paresthesias     Physical Exam   Triage Vital Signs: ED Triage Vitals  Enc Vitals Group     BP 07/31/21 2105 (!) 196/104     Pulse Rate 07/31/21 2105 89     Resp 07/31/21 2105 19     Temp 07/31/21 2105 97.9 F (36.6 C)     Temp Source 07/31/21 2105 Oral     SpO2 07/31/21 2105 97 %     Weight 07/31/21 2106 200 lb (90.7 kg)     Height 07/31/21 2106 5\' 4"  (1.626 m)     Head Circumference --      Peak Flow --      Pain Score 07/31/21 2106 5     Pain Loc --      Pain Edu? --      Excl. in [place]? --     Most recent vital signs: Vitals:   07/31/21 2105  BP: (!) 196/104  Pulse: 89  Resp: 19  Temp: 97.9 F (36.6 C)  SpO2: 97%    General: Awake, oriented x4 CV:  Good peripheral perfusion.  Resp:  Normal effort.  Abd:  No distention.  Other:  Well-appearing aged Caucasian female in no distress   ED Results / Procedures / Treatments   Labs (all labs ordered are listed, but only abnormal results are displayed) Labs Reviewed  CBC - Abnormal; Notable for the following components:      Result Value   WBC 10.8 (*)    RBC 5.13 (*)    Platelets 441 (*)    All other components within normal limits  BASIC METABOLIC PANEL - Abnormal; Notable for the following components:   CO2  21 (*)    All other components within normal limits  URINALYSIS, ROUTINE W REFLEX MICROSCOPIC - Abnormal; Notable for the following components:   APPearance CLEAR (*)    Bacteria, UA RARE (*)    All other components within normal limits  POC URINE PREG, ED  TROPONIN I (HIGH SENSITIVITY)  TROPONIN I (HIGH SENSITIVITY)   PROCEDURES:  Critical Care performed: No  Procedures   MEDICATIONS ORDERED IN ED: Medications  sodium chloride 0.9 % bolus 1,000 mL (1,000 mLs Intravenous New Bag/Given 07/31/21 2220)  dexamethasone (DECADRON) injection 10 mg (10 mg Intravenous Given 07/31/21 2221)  ketorolac (TORADOL) 30 MG/ML injection 30 mg (30 mg Intravenous Given 07/31/21 2221)  metoCLOPramide (REGLAN) injection 10 mg (10 mg Intravenous Given 07/31/21 2221)     IMPRESSION / MDM / ASSESSMENT AND PLAN / ED COURSE  I reviewed the triage vital signs and the nursing notes.  Presents with Headache.  No focal neurological symptoms. Neuro exam is benign. Pt is nontoxic. VSS.  Based on history and normal neurological exam I have low suspicion for intracranial tumor, intracranial bleed, meningitis, temporal arteritis, glaucoma, CO poisoning. Laboratory evaluation significant for: CBC showing mild white blood cell count elevation of 10.8 BMP showing CO2 mildly decreased at 21 that is not clinically significant Urinalysis showing rare bacteria.  Patient has no urinary symptoms Most likely patient has benign headache, recommend rest, hydration, and ibuprofen. Patient's blood pressure significantly improved after treatment of her migraine Disposition: Discharge home with strict return precautions and instructions for prompt primary care/neurology follow up.       FINAL CLINICAL IMPRESSION(S) / ED DIAGNOSES   Final diagnoses:  Migraine without aura and with status migrainosus, not intractable     Rx / DC Orders   ED Discharge Orders          Ordered     butalbital-acetaminophen-caffeine (FIORICET) 50-325-40 MG tablet  Every 6 hours PRN        07/31/21 2324             Note:  This document was prepared using Dragon voice recognition software and may include unintentional dictation errors.   [name], MD 07/31/21 [phone]

## 2021-10-03 ENCOUNTER — Encounter: Payer: Medicaid Other | Admitting: Obstetrics and Gynecology

## 2021-10-06 ENCOUNTER — Other Ambulatory Visit: Payer: Self-pay

## 2021-10-06 ENCOUNTER — Telehealth: Payer: Self-pay | Admitting: Obstetrics and Gynecology

## 2021-10-06 DIAGNOSIS — Z3041 Encounter for surveillance of contraceptive pills: Secondary | ICD-10-CM

## 2021-10-06 MED ORDER — DESOGESTREL-ETHINYL ESTRADIOL 0.15-0.02/0.01 MG (21/5) PO TABS: 1.0000 | ORAL_TABLET | Freq: Every day | ORAL | 0 refills | Status: DC

## 2021-10-06 NOTE — Telephone Encounter (Signed)
Birth Control runs out in 3 days. Pt does not have any refills ?

## 2021-10-06 NOTE — Telephone Encounter (Signed)
Sent in 1 Rx and let patient know. ?

## 2021-10-09 ENCOUNTER — Encounter: Payer: Medicaid Other | Admitting: Obstetrics and Gynecology

## 2021-11-14 ENCOUNTER — Encounter: Payer: Medicaid Other | Admitting: Obstetrics and Gynecology

## 2021-12-22 ENCOUNTER — Other Ambulatory Visit: Payer: Self-pay | Admitting: Obstetrics and Gynecology

## 2021-12-22 DIAGNOSIS — Z3041 Encounter for surveillance of contraceptive pills: Secondary | ICD-10-CM

## 2021-12-24 ENCOUNTER — Encounter: Payer: Self-pay | Admitting: Obstetrics and Gynecology

## 2021-12-24 ENCOUNTER — Ambulatory Visit (INDEPENDENT_AMBULATORY_CARE_PROVIDER_SITE_OTHER): Payer: Medicaid Other | Admitting: Obstetrics and Gynecology

## 2021-12-24 ENCOUNTER — Other Ambulatory Visit: Payer: Self-pay

## 2021-12-24 VITALS — BP 142/98 | HR 80 | Ht 64.0 in | Wt 215.5 lb

## 2021-12-24 DIAGNOSIS — Z01419 Encounter for gynecological examination (general) (routine) without abnormal findings: Secondary | ICD-10-CM

## 2021-12-24 DIAGNOSIS — Z3041 Encounter for surveillance of contraceptive pills: Secondary | ICD-10-CM

## 2021-12-24 MED ORDER — DESOGESTREL-ETHINYL ESTRADIOL 0.15-0.02/0.01 MG (21/5) PO TABS: 1.0000 | ORAL_TABLET | Freq: Every day | ORAL | 0 refills | Status: DC

## 2021-12-24 NOTE — Progress Notes (Signed)
Patients presents for annual exam today. She states doing well, likes her current OCP and that it gives her light and regular periods. Patient is up to date on pap smear. Patient states no other questions or concerns at this time.

## 2021-12-24 NOTE — Progress Notes (Signed)
HPI:      [name] is a 31 y.o. [id number] who LMP was Patient's last menstrual period was 12/03/2021.  Subjective:   She presents today for her annual examination.  She has no complaints or issues.  She is having normal regular cycles on OCPs.  She would like to continue OCPs.    Hx: The following portions of the patient's history were reviewed and updated as appropriate:             She  has a past medical history of Anxiety, Depression, GERD (gastroesophageal reflux disease), History of abnormal cervical Pap smear (08/19/2016), Hypertension, and Migraines. She does not have any pertinent problems on file. She  has a past surgical history that includes Cholecystectomy and Cesarean section (N/A, 03/05/2017). Her family history includes Asthma in her brother; Diabetes in her maternal grandmother; Heart failure in her maternal grandmother; Hyperlipidemia in her mother; Stroke in her maternal grandmother; Thyroid disease in an other family member. She  reports that she quit smoking about 5 years ago. Her smoking use included cigarettes. She smoked an average of 1 pack per day. She has never used smokeless tobacco. She reports current alcohol use. She reports that she does not use drugs. She has a current medication list which includes the following prescription(s): butalbital-acetaminophen-caffeine, multivitamin, desogestrel-ethinyl estradiol, and [DISCONTINUED] metoprolol succinate. She is allergic to bee venom.       Review of Systems:  Review of Systems  Constitutional: Denied constitutional symptoms, night sweats, recent illness, fatigue, fever, insomnia and weight loss.  Eyes: Denied eye symptoms, eye pain, photophobia, vision change and visual disturbance.  Ears/Nose/Throat/Neck: Denied ear, nose, throat or neck symptoms, hearing loss, nasal discharge, sinus congestion and sore throat.  Cardiovascular: Denied cardiovascular symptoms, arrhythmia, chest pain/pressure, edema,  exercise intolerance, orthopnea and palpitations.  Respiratory: Denied pulmonary symptoms, asthma, pleuritic pain, productive sputum, cough, dyspnea and wheezing.  Gastrointestinal: Denied, gastro-esophageal reflux, melena, nausea and vomiting.  Genitourinary: Denied genitourinary symptoms including symptomatic vaginal discharge, pelvic relaxation issues, and urinary complaints.  Musculoskeletal: Denied musculoskeletal symptoms, stiffness, swelling, muscle weakness and myalgia.  Dermatologic: Denied dermatology symptoms, rash and scar.  Neurologic: Denied neurology symptoms, dizziness, headache, neck pain and syncope.  Psychiatric: Denied psychiatric symptoms, anxiety and depression.  Endocrine: Denied endocrine symptoms including hot flashes and night sweats.   Meds:   Current Outpatient Medications on File Prior to Visit  Medication Sig Dispense Refill   butalbital-acetaminophen-caffeine (FIORICET) 50-325-40 MG tablet Take 1-2 tablets by mouth every 6 (six) hours as needed for headache. 20 tablet 0   Multiple Vitamin (MULTIVITAMIN) capsule Take 1 capsule by mouth daily.     [DISCONTINUED] metoprolol succinate (TOPROL-XL) 25 MG 24 hr tablet Take by mouth. (Patient not taking: No sig reported)     No current facility-administered medications on file prior to visit.     Objective:     Vitals:   12/24/21 0807  BP: (!) 142/98  Pulse: 80    Filed Weights   12/24/21 0807  Weight: 215 lb 8 oz (97.8 kg)              Physical examination General NAD, Conversant  HEENT Atraumatic; Op clear with mmm.  Normo-cephalic. Pupils reactive. Anicteric sclerae  Thyroid/Neck Smooth without nodularity or enlargement. Normal ROM.  Neck Supple.  Skin No rashes, lesions or ulceration. Normal palpated skin turgor. No nodularity.  Breasts: No masses or discharge.  Symmetric.  No axillary adenopathy.  Lungs: Clear to auscultation.No rales  or wheezes. Normal Respiratory effort, no retractions.  Heart:  NSR.  No murmurs or rubs appreciated. No periferal edema  Abdomen: Soft.  Non-tender.  No masses.  No HSM. No hernia  Extremities: Moves all appropriately.  Normal ROM for age. No lymphadenopathy.  Neuro: Oriented to PPT.  Normal mood. Normal affect.     Pelvic:   Vulva: Normal appearance.  No lesions.  Vagina: No lesions or abnormalities noted.  Support: Normal pelvic support.  Urethra No masses tenderness or scarring.  Meatus Normal size without lesions or prolapse.  Cervix: Normal appearance.  No lesions.  Anus: Normal exam.  No lesions.  Perineum: Normal exam.  No lesions.        Bimanual   Uterus: Normal size.  Non-tender.  Mobile.  AV.  Adnexae: No masses.  Non-tender to palpation.  Cul-de-sac: Negative for abnormality.     Assessment:    [id number] Patient Active Problem List   Diagnosis Date Noted   Delivery by cesarean section using transverse incision of lower segment of uterus 03/05/2017   Family history of cleft lip and palate    Fetal cleft lip affecting antepartum care of mother, fetus 1    History of abnormal cervical Pap smear 08/19/2016   Obesity (BMI 35.0-39.9 without comorbidity) 08/19/2016     1. Well woman exam with routine gynecological exam     Normal exam  OCPs without problem   Plan:            1.  Basic Screening Recommendations The basic screening recommendations for asymptomatic women were discussed with the patient during her visit.  The age-appropriate recommendations were discussed with her and the rational for the tests reviewed.  When I am informed by the patient that another primary care physician has previously obtained the age-appropriate tests and they are up-to-date, only outstanding tests are ordered and referrals given as necessary.  Abnormal results of tests will be discussed with her when all of her results are completed.  Routine preventative health maintenance measures emphasized: Exercise/Diet/Weight control, Tobacco Warnings,  Alcohol/Substance use risks and Stress Management  2.  Continue OCPs  Orders No orders of the defined types were placed in this encounter.   No orders of the defined types were placed in this encounter.         F/U  Return in about 1 year (around 12/25/2022) for Annual Physical.  Elonda Husky, M.D. 12/24/2021 9:12 AM

## 2022-04-17 ENCOUNTER — Encounter: Payer: Self-pay | Admitting: Obstetrics and Gynecology

## 2022-04-28 ENCOUNTER — Encounter: Payer: Self-pay | Admitting: Emergency Medicine

## 2022-04-28 ENCOUNTER — Other Ambulatory Visit: Payer: Self-pay

## 2022-04-28 ENCOUNTER — Emergency Department
Admission: EM | Admit: 2022-04-28 | Discharge: 2022-04-28 | Disposition: A | Discharge status: Home / Self Care | Payer: Medicaid Other | Attending: Emergency Medicine | Admitting: Emergency Medicine

## 2022-04-28 ENCOUNTER — Emergency Department: Payer: Medicaid Other

## 2022-04-28 DIAGNOSIS — M25512 Pain in left shoulder: Secondary | ICD-10-CM | POA: Insufficient documentation

## 2022-04-28 DIAGNOSIS — I10 Essential (primary) hypertension: Secondary | ICD-10-CM | POA: Diagnosis not present

## 2022-04-28 LAB — BASIC METABOLIC PANEL
Anion gap: 9 (ref 5–15)
BUN: 9 mg/dL (ref 6–20)
CO2: 22 mmol/L (ref 22–32)
Calcium: 9.1 mg/dL (ref 8.9–10.3)
Chloride: 107 mmol/L (ref 98–111)
Creatinine, Ser: 0.6 mg/dL (ref 0.44–1.00)
GFR, Estimated: >60 mL/min (ref >60)
Glucose, Bld: 88 mg/dL (ref 70–99)
Potassium: 3.4 mmol/L — ABNORMAL LOW (ref 3.5–5.1)
Sodium: 138 mmol/L (ref 135–145)

## 2022-04-28 LAB — CBC
HCT: 40.9 % (ref 36.0–46.0)
Hemoglobin: 14.2 g/dL (ref 12.0–15.0)
MCH: 27.3 pg (ref 26.0–34.0)
MCHC: 34.7 g/dL (ref 30.0–36.0)
MCV: 78.5 fL — ABNORMAL LOW (ref 80.0–100.0)
Platelets: 399 10*3/uL (ref 150–400)
RBC: 5.21 MIL/uL — ABNORMAL HIGH (ref 3.87–5.11)
RDW: 12.6 % (ref 11.5–15.5)
WBC: 12.4 10*3/uL — ABNORMAL HIGH (ref 4.0–10.5)
nRBC: 0 % (ref 0.0–0.2)

## 2022-04-28 LAB — TROPONIN I (HIGH SENSITIVITY): Troponin I (High Sensitivity): 4 ng/L (ref <18)

## 2022-04-28 LAB — POC URINE PREG, ED: Preg Test, Ur: NEGATIVE

## 2022-04-28 NOTE — Discharge Instructions (Addendum)
Please use ibuprofen (Motrin) up to 800 mg every 8 hours, naproxen (Naprosyn) up to 500 mg every 12 hours, and/or acetaminophen (Tylenol) up to 4 g/day for any continued pain  Please take your blood pressure over the next few days as the shoulder pain subsides.  If your blood pressure continues to run above 150 on the top number or above 90 on the bottom, please start taking your prescribed losartan

## 2022-04-28 NOTE — ED Notes (Signed)
Signing pad did not work, pt verbalized understanding of DC instructions. 

## 2022-04-28 NOTE — ED Provider Notes (Signed)
[facility] Provider Note   Event Date/Time   First MD Initiated Contact with Patient 04/28/22 2051     (approximate) History  Hypertension (/)  HPI Alicia Hendricks is a 31 y.o. female with stated past medical history of hypertension who presents for multiple different complaints including left shoulder pain and hypertension.  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  Patient states that palpation over this area worsens the pain as well as attempts to abduct the shoulder against resistance.  Patient denies any relieving factors but has not used any medications for this pain. ROS: Patient currently denies any vision changes, tinnitus, difficulty speaking, facial droop, sore throat, chest pain, shortness of breath, abdominal pain, nausea/vomiting/diarrhea, dysuria, or weakness/numbness/paresthesias in any extremity   Physical Exam  Triage Vital Signs: ED Triage Vitals  Enc Vitals Group     BP 04/28/22 2023 (!) 173/115     Pulse Rate 04/28/22 2023 96     Resp 04/28/22 2023 16     Temp 04/28/22 2023 97.9 F (36.6 C)     Temp Source 04/28/22 2023 Oral     SpO2 04/28/22 2023 97 %     Weight 04/28/22 2024 205 lb (93 kg)     Height 04/28/22 2024 5\' 3"  (1.6 m)     Head Circumference --      Peak Flow --      Pain Score 04/28/22 2024 5     Pain Loc --      Pain Edu? --      Excl. in [place]? --    Most recent vital signs: Vitals:   04/28/22 2023  BP: (!) 173/115  Pulse: 96  Resp: 16  Temp: 97.9 F (36.6 C)  SpO2: 97%   General: Awake, oriented x4. CV:  Good peripheral perfusion.  Resp:  Normal effort.  Abd:  No distention.  Other:  Middle-aged overweight Caucasian female laying in bed in no acute distress.  Tenderness to palpation over the lateral shoulder joints on the left as well as worsening pain over  the lateral shoulder with resisted abduction.  Full range of motion at this left shoulder with minimal pain. ED Results / Procedures / Treatments  Labs (all labs ordered are listed, but only abnormal results are displayed) Labs Reviewed  BASIC METABOLIC PANEL - Abnormal; Notable for the following components:      Result Value   Potassium 3.4 (*)    All other components within normal limits  CBC - Abnormal; Notable for the following components:   WBC 12.4 (*)    RBC 5.21 (*)    MCV 78.5 (*)    All other components within normal limits  POC URINE PREG, ED  TROPONIN I (HIGH SENSITIVITY)   EKG ED ECG REPORT I, 04/30/22, the attending physician, personally viewed and interpreted this ECG. Date: 04/28/2022 EKG Time: 2026 Rate: 90 Rhythm: normal sinus rhythm QRS Axis: normal Intervals: normal ST/T Wave abnormalities: normal Narrative Interpretation: no evidence of acute ischemia RADIOLOGY ED MD interpretation: 2 view chest x-ray interpreted by me shows no evidence of acute abnormalities including no pneumonia, pneumothorax, or widened mediastinum -Agree with radiology assessment Official radiology report(s): DG Chest 2 View  Result Date: 04/28/2022 CLINICAL DATA:  Chest pain; hypertension EXAM: CHEST - 2 VIEW COMPARISON:  03/30/2020  FINDINGS: Cardiac and mediastinal contours are within normal limits. No focal pulmonary opacity. No pleural effusion or pneumothorax. No acute osseous abnormality. IMPRESSION: No acute cardiopulmonary process. Electronically Signed   By: Merilyn Baba M.D.   On: 04/28/2022 20:55   PROCEDURES: Critical Care performed: No Procedures MEDICATIONS ORDERED IN ED: Medications - No data to display IMPRESSION / MDM / [name] / ED COURSE  I reviewed the triage vital signs and the nursing notes.                             Patient's presentation is most consistent with acute presentation with potential threat to life or bodily  function. Presents to the emergency department complaining of high blood pressure. Patient is otherwise asymptomatic without confusion, chest pain, hematuria, or SOB. Denies nonadherence to antihypertensive regimen DDx: CV, AMI, heart failure, renal infarction or failure or other end organ damage. Patient does not have any limited range of motion at the left shoulder and likely has sprain.  Patient given instructions for rehab and strict return precautions Disposition: Discussed with patient their elevated blood pressure and need for close outpatient management of their hypertension. Will provide a prescription for the patients previous antihypertensive medication and arrange for the patient to follow up in a primary care clinic    FINAL CLINICAL IMPRESSION(S) / ED DIAGNOSES   Final diagnoses:  Primary hypertension  Acute pain of left shoulder   Rx / DC Orders   ED Discharge Orders     None      Note:  This document was prepared using Dragon voice recognition software and may include unintentional dictation errors.   [name], MD 04/28/22 [phone]

## 2022-04-28 NOTE — ED Triage Notes (Signed)
Pt to ED from home c/o hypertension today.  States left arm pain today made her check BP which was 173/111.  Hx HTN and not on meds.  States pain worse with pressing on arm.  Denies SOB or n/v/d.  Pt A&Ox4, chest rise even and unlabored, skin WNL and in NAD at this time.

## 2022-05-26 DIAGNOSIS — K6289 Other specified diseases of anus and rectum: Secondary | ICD-10-CM | POA: Insufficient documentation

## 2022-05-29 ENCOUNTER — Other Ambulatory Visit: Payer: Self-pay | Admitting: Obstetrics and Gynecology

## 2022-05-29 DIAGNOSIS — Z3041 Encounter for surveillance of contraceptive pills: Secondary | ICD-10-CM

## 2022-08-17 ENCOUNTER — Emergency Department: Payer: Medicaid Other

## 2022-08-17 ENCOUNTER — Emergency Department
Admission: EM | Admit: 2022-08-17 | Discharge: 2022-08-17 | Disposition: A | Discharge status: Home / Self Care | Payer: Medicaid Other | Attending: Emergency Medicine | Admitting: Emergency Medicine

## 2022-08-17 ENCOUNTER — Ambulatory Visit
Admission: EM | Admit: 2022-08-17 | Discharge: 2022-08-17 | Disposition: A | Discharge status: Home / Self Care | Payer: Medicaid Other

## 2022-08-17 ENCOUNTER — Other Ambulatory Visit: Payer: Self-pay

## 2022-08-17 DIAGNOSIS — R531 Weakness: Secondary | ICD-10-CM

## 2022-08-17 DIAGNOSIS — R519 Headache, unspecified: Secondary | ICD-10-CM | POA: Insufficient documentation

## 2022-08-17 DIAGNOSIS — I10 Essential (primary) hypertension: Secondary | ICD-10-CM | POA: Insufficient documentation

## 2022-08-17 DIAGNOSIS — R200 Anesthesia of skin: Secondary | ICD-10-CM | POA: Diagnosis not present

## 2022-08-17 LAB — COMPREHENSIVE METABOLIC PANEL
ALT: 71 U/L — ABNORMAL HIGH (ref 0–44)
AST: 45 U/L — ABNORMAL HIGH (ref 15–41)
Albumin: 3.9 g/dL (ref 3.5–5.0)
Alkaline Phosphatase: 112 U/L (ref 38–126)
Anion gap: 14 (ref 5–15)
BUN: 10 mg/dL (ref 6–20)
CO2: 20 mmol/L — ABNORMAL LOW (ref 22–32)
Calcium: 9.2 mg/dL (ref 8.9–10.3)
Chloride: 104 mmol/L (ref 98–111)
Creatinine, Ser: 0.58 mg/dL (ref 0.44–1.00)
GFR, Estimated: >60 mL/min (ref >60)
Glucose, Bld: 88 mg/dL (ref 70–99)
Potassium: 3.5 mmol/L (ref 3.5–5.1)
Sodium: 138 mmol/L (ref 135–145)
Total Bilirubin: 0.9 mg/dL (ref 0.3–1.2)
Total Protein: 7.8 g/dL (ref 6.5–8.1)

## 2022-08-17 LAB — CBC WITH DIFFERENTIAL/PLATELET
Abs Immature Granulocytes: 0.03 10*3/uL (ref 0.00–0.07)
Basophils Absolute: 0.1 10*3/uL (ref 0.0–0.1)
Basophils Relative: 1 %
Eosinophils Absolute: 0.1 10*3/uL (ref 0.0–0.5)
Eosinophils Relative: 1 %
HCT: 40.9 % (ref 36.0–46.0)
Hemoglobin: 14.3 g/dL (ref 12.0–15.0)
Immature Granulocytes: 0 %
Lymphocytes Relative: 24 %
Lymphs Abs: 2.4 10*3/uL (ref 0.7–4.0)
MCH: 27.7 pg (ref 26.0–34.0)
MCHC: 35 g/dL (ref 30.0–36.0)
MCV: 79.1 fL — ABNORMAL LOW (ref 80.0–100.0)
Monocytes Absolute: 0.6 10*3/uL (ref 0.1–1.0)
Monocytes Relative: 6 %
Neutro Abs: 6.9 10*3/uL (ref 1.7–7.7)
Neutrophils Relative %: 68 %
Platelets: 438 10*3/uL — ABNORMAL HIGH (ref 150–400)
RBC: 5.17 MIL/uL — ABNORMAL HIGH (ref 3.87–5.11)
RDW: 12.3 % (ref 11.5–15.5)
WBC: 10.1 10*3/uL (ref 4.0–10.5)
nRBC: 0 % (ref 0.0–0.2)

## 2022-08-17 LAB — ETHANOL: Alcohol, Ethyl (B): <10 mg/dL (ref <10)

## 2022-08-17 LAB — D-DIMER, QUANTITATIVE: D-Dimer, Quant: 0.41 ug/mL-FEU (ref 0.00–0.50)

## 2022-08-17 LAB — URINE DRUG SCREEN, QUALITATIVE (ARMC ONLY)
Amphetamines, Ur Screen: NOT DETECTED
Barbiturates, Ur Screen: NOT DETECTED
Benzodiazepine, Ur Scrn: NOT DETECTED
Cannabinoid 50 Ng, Ur ~~LOC~~: NOT DETECTED
Cocaine Metabolite,Ur ~~LOC~~: NOT DETECTED
MDMA (Ecstasy)Ur Screen: NOT DETECTED
Methadone Scn, Ur: NOT DETECTED
Opiate, Ur Screen: NOT DETECTED
Phencyclidine (PCP) Ur S: NOT DETECTED
Tricyclic, Ur Screen: NOT DETECTED

## 2022-08-17 LAB — PREGNANCY, URINE: Preg Test, Ur: NEGATIVE

## 2022-08-17 LAB — CBG MONITORING, ED: Glucose-Capillary: 96 mg/dL (ref 70–99)

## 2022-08-17 MED ORDER — GADOBUTROL 1 MMOL/ML IV SOLN
9.0000 mL | Freq: Once | INTRAVENOUS | Status: AC | PRN
  Administered 2022-08-17: 9 mL via INTRAVENOUS

## 2022-08-17 MED ORDER — METOCLOPRAMIDE HCL 5 MG/ML IJ SOLN
10.0000 mg | Freq: Once | INTRAMUSCULAR | Status: AC
  Administered 2022-08-17: 10 mg via INTRAVENOUS
  Filled 2022-08-17: qty 2

## 2022-08-17 MED ORDER — ACETAMINOPHEN 500 MG PO TABS
1000.0000 mg | ORAL_TABLET | Freq: Once | ORAL | Status: AC
  Administered 2022-08-17: 1000 mg via ORAL
  Filled 2022-08-17: qty 2

## 2022-08-17 MED ORDER — DIPHENHYDRAMINE HCL 50 MG/ML IJ SOLN
12.5000 mg | Freq: Once | INTRAMUSCULAR | Status: AC
  Administered 2022-08-17: 12.5 mg via INTRAVENOUS
  Filled 2022-08-17: qty 1

## 2022-08-17 NOTE — ED Provider Notes (Addendum)
7:33 PM Assumed care for off going team.   Blood pressure (!) 162/105, pulse 65, temperature (!) 97.4 F (36.3 C), resp. rate 16, last menstrual period 08/10/2022, SpO2 99 %.  See their HPI for full report but in brief pending MRI.  MRI without contrast was negative but MRI with contrast did not have any contrast to evaluate.  Discussed with Dr. Quinn Axe who did recommend repeating with contrast.  If this is negative patient can be discharged home.  Patient does report a headache that has now started that is mild in nature and has a history of migraines we discussed the possibility of a complex migraine.  She reports that this headache is less severe than her normal headaches.  Does not sound like a subarachnoid.  Given neurology was off call at 8 PM I did discuss with them prior to the MRI results and that if it was normal patient was cleared for discharge home and can follow-up outpatient with neurology.  IMPRESSION: Normal T1 weighted postcontrast brain imaging.  Reevaluated patient and she has near resolution of symptoms a little bit of tingling near the IV site but she thinks it could just be related to the IV.  We discussed return precautions at this time she feels comfortable with discharge home  Slight LFT elevation but denies any abdominal pain does use Tylenol occasionally but no more than 2 g in a day.  Discussed that she should follow this up with her primary care doctor as well as her blood pressure.          Vanessa Killian, MD 08/17/22 2226

## 2022-08-17 NOTE — ED Provider Notes (Signed)
Alicia Hendricks    CSN: [id number] Arrival date & time: 08/17/22  1223      History   Chief Complaint Chief Complaint  Patient presents with   Extremity Weakness    Entered by patient   Eye Problem    HPI Alicia Hendricks is a 32 y.o. female.   HPI  Patient presents to urgent care with concern for left upper extremity weakness and left-sided facial "heaviness.  Alerted to her condition by triage nurse and patient is immediately discharged to go to ED for evaluation given the acute nature of her symptoms.  She refuses EMS transport.  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/26/2022   Essential hypertension 10/25/2020   Anxiety 04/13/2020   Delivery by cesarean section using transverse incision of lower segment of uterus 03/05/2017   Family history of cleft lip and palate    Fetal cleft lip affecting antepartum care of mother, fetus 1    History of abnormal cervical Pap smear 08/19/2016   Obesity (BMI 35.0-39.9 without comorbidity) 08/19/2016    Past Surgical History:  Procedure Laterality Date   CESAREAN SECTION N/A 03/05/2017   Procedure: PRIMARY CESAREAN SECTION;  Surgeon: Harlin Heys, MD;  Location: [facility] ORS;  Service: Obstetrics;  Laterality: N/A;   CHOLECYSTECTOMY      OB History     Gravida  2   Para  2   Term  2   Preterm      AB      Living  2      SAB      IAB      Ectopic      Multiple      Live Births  2            Home Medications    Prior to Admission medications   Medication Sig Start Date End Date Taking? Authorizing Provider  AZURETTE 0.15-0.02/0.01 MG (21/5) tablet TAKE 1 TABLET BY MOUTH AT BEDTIME 06/02/22   Harlin Heys, MD  Multiple Vitamin (MULTIVITAMIN)  capsule Take 1 capsule by mouth daily.    [provider]  metoprolol succinate (TOPROL-XL) 25 MG 24 hr tablet Take by mouth. Patient not taking: No sig reported 03/08/20 10/14/20  [provider]    Family History Family History  Problem Relation Age of Onset   Hyperlipidemia Mother    Asthma Brother    Diabetes Maternal Grandmother    Heart failure Maternal Grandmother    Stroke Maternal Grandmother    Thyroid disease Other    Breast cancer Neg Hx    Ovarian cancer Neg Hx    Colon cancer Neg Hx     Social History Social History   Tobacco Use   Smoking status: Former    Packs/day: 1.00    Types: Cigarettes    Quit date: 07/06/2016    Years since quitting: 6.1   Smokeless tobacco: Never  Vaping Use   Vaping Use: Some days   Last attempt to quit: 10/26/2021  Substance Use Topics   Alcohol use: Yes    Comment: occ   Drug use: No     Allergies   Bee venom   Review  of Systems Review of Systems   Physical Exam Triage Vital Signs ED Triage Vitals  Enc Vitals Group     BP 08/17/22 1253 (!) 156/92     Pulse Rate 08/17/22 1253 82     Resp 08/17/22 1253 16     Temp 08/17/22 1253 98.8 F (37.1 C)     Temp Source 08/17/22 1253 Oral     SpO2 08/17/22 1253 97 %     Weight --      Height --      Head Circumference --      Peak Flow --      Pain Score 08/17/22 1252 0     Pain Loc --      Pain Edu? --      Excl. in [place]? --    No data found.  Updated Vital Signs BP (!) 156/92 (BP Location: Left Arm)   Pulse 82   Temp 98.8 F (37.1 C) (Oral)   Resp 16   LMP 08/10/2022 (Approximate)   SpO2 97%   Visual Acuity Right Eye Distance:   Left Eye Distance:   Bilateral Distance:    Right Eye Near:   Left Eye Near:    Bilateral Near:     Physical Exam   UC Treatments / Results  Labs (all labs ordered are listed, but only abnormal results are displayed) Labs Reviewed - No data to display  EKG   Radiology No results  found.  Procedures Procedures (including critical care time)  Medications Ordered in UC Medications - No data to display  Initial Impression / Assessment and Plan / UC Course  I have reviewed the triage vital signs and the nursing notes.  Pertinent labs & imaging results that were available during my care of the patient were reviewed by me and considered in my medical decision making (see chart for details).      Final Clinical Impressions(s) / UC Diagnoses   Final diagnoses:  Left-sided weakness     Discharge Instructions      Patient discharged to ED without evaluation.   ED Prescriptions   None    PDMP not reviewed this encounter.   Rose Phi, [place] 08/17/22 1258

## 2022-08-17 NOTE — ED Triage Notes (Signed)
Patient presents to UC for left upper extremity weakness and left sided facial "heaviness." States this happened at about 0830 today. States she has a hx of anxiety no hx of HTN. She reports she did have a surgical procedure last week. Family hx of stroke. Provider notified of patients concerns, provider recommends further evaluation at the ED. Patient declined EMS transport. States that she drove herself to the UC and can driver herself to the ED. Provider aware.   Denies changes to speech, changes to vision, HA, chest pain, SOB.

## 2022-08-17 NOTE — ED Provider Notes (Signed)
[facility] Provider Note    Event Date/Time   First MD Initiated Contact with Patient 08/17/22 1343     (approximate)   History   No chief complaint on file.   HPI  Alicia Hendricks is a 32 y.o. female   Past medical history of hypertension migraines anxiety and depression who had a hemorrhoid surgery last week who presents to the emergency department with sudden onset left-sided facial numbness and left arm numbness at 8 AM.  She awoke feeling normal at 6 AM and helped her child get ready for school.  The numbness has stayed the same since onset she went to urgent care initially who referred her to the emergency department for stroke/TIA workup.  She denies headache.  No other acute medical complaints.   External Medical Documents Reviewed: Urgent care note dated earlier around 1 PM noting numbness and urging the patient to come to the emergency department for further evaluation.      Physical Exam   Triage Vital Signs: ED Triage Vitals [08/17/22 1320]  Enc Vitals Group     BP (!) 167/105     Pulse Rate 80     Resp 17     Temp (!) 97.3 F (36.3 C)     Temp Source Oral     SpO2 96 %     Weight      Height      Head Circumference      Peak Flow      Pain Score 0     Pain Loc      Pain Edu?      Excl. in [place]?     Most recent vital signs: Vitals:   08/17/22 1320  BP: (!) 167/105  Pulse: 80  Resp: 17  Temp: (!) 97.3 F (36.3 C)  SpO2: 96%    General: Awake, no distress.  CV:  Good peripheral perfusion.  Resp:  Normal effort.  Abd:  No distention.  Other:  Awake alert oriented pleasant and she does have some numbness to palpation of the left side of her face as well as the arm, she states a sensation of cold.  No motor deficits.  Finger-nose normal.  No dysarthria no facial asymmetry.   ED Results / Procedures / Treatments   Labs (all labs ordered are listed, but only abnormal results are displayed) Labs Reviewed  CBC WITH  DIFFERENTIAL/PLATELET - Abnormal; Notable for the following components:      Result Value   RBC 5.17 (*)    MCV 79.1 (*)    Platelets 438 (*)    All other components within normal limits  COMPREHENSIVE METABOLIC PANEL - Abnormal; Notable for the following components:   CO2 20 (*)    AST 45 (*)    ALT 71 (*)    All other components within normal limits  D-DIMER, QUANTITATIVE  ETHANOL  URINE DRUG SCREEN, QUALITATIVE ([facility] ONLY)  CBG MONITORING, ED  POC URINE PREG, ED     I ordered and reviewed the above labs they are notable for her blood glucose is normal.  EKG  ED ECG REPORT I, Lucillie Garfinkel, the attending physician, personally viewed and interpreted this ECG.   Date: 08/17/2022  EKG Time: 1426  Rate: 68  Rhythm: normal sinus rhythm  Axis: nl  Intervals:none  ST&T Change: no acute ischemci changes    RADIOLOGY I independently reviewed and interpreted CT of the head see no obvious bleeding or midline shift.  PROCEDURES:  Critical Care performed: No  Procedures   MEDICATIONS ORDERED IN ED: Medications - No data to display  External physician / consultants:  I spoke with neurologist Dr. Quinn Axe regarding care plan for this patient.   IMPRESSION / MDM / ASSESSMENT AND PLAN / ED COURSE  I reviewed the triage vital signs and the nursing notes.                                Patient's presentation is most consistent with acute presentation with potential threat to life or bodily function.  Differential diagnosis includes, but is not limited to, CVA, TIA, electrolyte derangement   The patient is on the cardiac monitor to evaluate for evidence of arrhythmia and/or significant heart rate changes.  MDM: She is outside the window for thrombolytics and her are mild.  She has a right internal capsule lesion on CT scan that may be artifactual but also may correspond to the area where she is experiencing contralateral symptoms.  I consulted with Dr. Quinn Axe who agreed no  thrombolytics but wants to get an MRI with and without contrast of the brain for further evaluation.  Signed out to oncoming provider in stable condition pending this workup.        FINAL CLINICAL IMPRESSION(S) / ED DIAGNOSES   Final diagnoses:  Facial numbness  Left arm numbness     Rx / DC Orders   ED Discharge Orders     None        Note:  This document was prepared using Dragon voice recognition software and may include unintentional dictation errors.    Lucillie Garfinkel, MD 08/17/22 [phone]

## 2022-08-17 NOTE — ED Triage Notes (Addendum)
Pt c/o left facial numbness and tingling that goes down left arm and leg that started at 8:30 am and has since resolved to only mild tingling in left arm at this time. Pt states she went to urgent care and was sent to ED for possible TIA. Pt reports recent hemorrhoid surgery. Pt does not take blood thinners, BP meds are held due to surgery. No weakness or slurred speech noted, pt denies visual deficits. Pt denies SHOB, CP, dizziness. Pt AOX4, NAD noted.

## 2022-08-17 NOTE — ED Notes (Signed)
Patient is being discharged from the Urgent Care and sent to the Emergency Department via POV . Per Annie Main, NP, patient is in need of higher level of care due to further evaluation. Patient is aware and verbalizes understanding of plan of care.  Vitals:   08/17/22 1253  BP: (!) 156/92  Pulse: 82  Resp: 16  Temp: 98.8 F (37.1 C)  SpO2: 97%

## 2022-08-17 NOTE — Discharge Instructions (Signed)
Your workup was reassuring this could have been a complex migraine.  Follow-up with neurology outpatient return to the ER if you have return of symptoms or worsening symptoms or any other concerns.  Your liver function test were slightly elevated unlikely to be related to this event today but should follow this up with a primary care doctor for a recheck as well as your blood pressure.

## 2022-08-17 NOTE — Discharge Instructions (Addendum)
Patient discharged to ED without evaluation.

## 2022-08-27 ENCOUNTER — Other Ambulatory Visit: Payer: Self-pay | Admitting: Obstetrics and Gynecology

## 2022-08-27 DIAGNOSIS — Z3041 Encounter for surveillance of contraceptive pills: Secondary | ICD-10-CM

## 2022-11-13 ENCOUNTER — Other Ambulatory Visit: Payer: Self-pay

## 2022-11-13 DIAGNOSIS — Z3041 Encounter for surveillance of contraceptive pills: Secondary | ICD-10-CM

## 2022-11-13 MED ORDER — DESOGESTREL-ETHINYL ESTRADIOL 0.15-0.02/0.01 MG (21/5) PO TABS: 1.0000 | ORAL_TABLET | Freq: Every day | ORAL | 0 refills | Status: DC

## 2023-01-01 ENCOUNTER — Ambulatory Visit: Payer: Medicaid Other | Admitting: Obstetrics and Gynecology

## 2023-01-26 ENCOUNTER — Ambulatory Visit: Payer: Medicaid Other | Admitting: Obstetrics and Gynecology

## 2023-01-26 DIAGNOSIS — Z01419 Encounter for gynecological examination (general) (routine) without abnormal findings: Secondary | ICD-10-CM

## 2023-03-04 ENCOUNTER — Encounter: Payer: Self-pay | Admitting: Obstetrics and Gynecology

## 2023-03-04 ENCOUNTER — Ambulatory Visit (INDEPENDENT_AMBULATORY_CARE_PROVIDER_SITE_OTHER): Payer: Medicaid Other | Admitting: Obstetrics and Gynecology

## 2023-03-04 VITALS — BP 158/112 | HR 96 | Ht 63.0 in | Wt 213.8 lb

## 2023-03-04 DIAGNOSIS — Z01419 Encounter for gynecological examination (general) (routine) without abnormal findings: Secondary | ICD-10-CM | POA: Diagnosis not present

## 2023-03-04 DIAGNOSIS — Z3041 Encounter for surveillance of contraceptive pills: Secondary | ICD-10-CM

## 2023-03-04 MED ORDER — DESOGESTREL-ETHINYL ESTRADIOL 0.15-0.02/0.01 MG (21/5) PO TABS: 1.0000 | ORAL_TABLET | Freq: Every day | ORAL | 3 refills | Status: AC

## 2023-03-04 NOTE — Progress Notes (Signed)
Patients presents for annual exam today. She states enjoying her current OCP, reports regular cycles and would like to continue. She is up to date on pap smear, due next year. She states no other questions or concerns at this time.

## 2023-03-04 NOTE — Progress Notes (Signed)
HPI:      Alicia Hendricks is a 32 y.o. [id number] who LMP was Patient's last menstrual period was 02/24/2023.  Subjective:   She presents today for her annual examination.  She is taking OCPs and having regular cycles.  She likes them and like to stay on them. She saw her primary care physician yesterday and was noted to have elevated blood pressures.  She was given a prescription for losartan but she has not yet started it.    Hx: The following portions of the patient's history were reviewed and updated as appropriate:             She  has a past medical history of Anxiety, Depression, GERD (gastroesophageal reflux disease), History of abnormal cervical Pap smear (08/19/2016), Hypertension, and Migraines. She does not have any pertinent problems on file. She  has a past surgical history that includes Cholecystectomy and Cesarean section (N/A, 03/05/2017). Her family history includes Asthma in her brother; Diabetes in her maternal grandmother; Heart failure in her maternal grandmother; Hyperlipidemia in her mother; Stroke in her maternal grandmother; Thyroid disease in an other family member. She  reports that she quit smoking about 6 years ago. Her smoking use included cigarettes. She has never used smokeless tobacco. She reports current alcohol use. She reports that she does not use drugs. She has a current medication list which includes the following prescription(s): multivitamin, desogestrel-ethinyl estradiol, and [DISCONTINUED] metoprolol succinate. She is allergic to bee venom.       Review of Systems:  Review of Systems  Constitutional: Denied constitutional symptoms, night sweats, recent illness, fatigue, fever, insomnia and weight loss.  Eyes: Denied eye symptoms, eye pain, photophobia, vision change and visual disturbance.  Ears/Nose/Throat/Neck: Denied ear, nose, throat or neck symptoms, hearing loss, nasal discharge, sinus congestion and sore throat.  Cardiovascular: Denied  cardiovascular symptoms, arrhythmia, chest pain/pressure, edema, exercise intolerance, orthopnea and palpitations.  Respiratory: Denied pulmonary symptoms, asthma, pleuritic pain, productive sputum, cough, dyspnea and wheezing.  Gastrointestinal: Denied, gastro-esophageal reflux, melena, nausea and vomiting.  Genitourinary: Denied genitourinary symptoms including symptomatic vaginal discharge, pelvic relaxation issues, and urinary complaints.  Musculoskeletal: Denied musculoskeletal symptoms, stiffness, swelling, muscle weakness and myalgia.  Dermatologic: Denied dermatology symptoms, rash and scar.  Neurologic: Denied neurology symptoms, dizziness, headache, neck pain and syncope.  Psychiatric: Denied psychiatric symptoms, anxiety and depression.  Endocrine: Denied endocrine symptoms including hot flashes and night sweats.   Meds:   Current Outpatient Medications on File Prior to Visit  Medication Sig Dispense Refill   Multiple Vitamin (MULTIVITAMIN) capsule Take 1 capsule by mouth daily.     [DISCONTINUED] metoprolol succinate (TOPROL-XL) 25 MG 24 hr tablet Take by mouth. (Patient not taking: No sig reported)     No current facility-administered medications on file prior to visit.     Objective:     Vitals:   03/04/23 0910 03/04/23 0919  BP: (!) 155/105 (!) 158/112  Pulse: 96     Filed Weights   03/04/23 0910  Weight: 213 lb 12.8 oz (97 kg)              Physical examination General NAD, Conversant  HEENT Atraumatic; Op clear with mmm.  Normo-cephalic. Pupils reactive. Anicteric sclerae  Thyroid/Neck Smooth without nodularity or enlargement. Normal ROM.  Neck Supple.  Skin No rashes, lesions or ulceration. Normal palpated skin turgor. No nodularity.  Breasts: No masses or discharge.  Symmetric.  No axillary adenopathy.  Lungs: Clear to auscultation.No rales or wheezes.  Normal Respiratory effort, no retractions.  Heart: NSR.  No murmurs or rubs appreciated. No peripheral  edema  Abdomen: Soft.  Non-tender.  No masses.  No HSM. No hernia  Extremities: Moves all appropriately.  Normal ROM for age. No lymphadenopathy.  Neuro: Oriented to PPT.  Normal mood. Normal affect.   She is not due for her Pap smear at this time and has chosen the option of declining pelvic exam.    Assessment:    [id number] Patient Active Problem List   Diagnosis Date Noted   Mass of anus 05/26/2022   Essential hypertension 10/25/2020   Anxiety 04/13/2020   Delivery by cesarean section using transverse incision of lower segment of uterus 03/05/2017   Family history of cleft lip and palate    Fetal cleft lip affecting antepartum care of mother, fetus 1    History of abnormal cervical Pap smear 08/19/2016   Obesity (BMI 35.0-39.9 without comorbidity) 08/19/2016     1. Well woman exam with routine gynecological exam   2. Oral contraceptive pill surveillance     Would like to continue OCPs.   Plan:            1.  Basic Screening Recommendations The basic screening recommendations for asymptomatic women were discussed with the patient during her visit.  The age-appropriate recommendations were discussed with her and the rational for the tests reviewed.  When I am informed by the patient that another primary care physician has previously obtained the age-appropriate tests and they are up-to-date, only outstanding tests are ordered and referrals given as necessary.  Abnormal results of tests will be discussed with her when all of her results are completed.  Routine preventative health maintenance measures emphasized: Exercise/Diet/Weight control, Tobacco Warnings, Alcohol/Substance use risks and Stress Management 2.  Continue OCPs 3.  Encouraged to begin her antihypertensive medication.  Hypertension and OCPs discussed in detail. Orders No orders of the defined types were placed in this encounter.    Meds ordered this encounter  Medications   desogestrel-ethinyl estradiol  (AZURETTE) 0.15-0.02/0.01 MG (21/5) tablet    Sig: Take 1 tablet by mouth at bedtime.    Dispense:  84 tablet    Refill:  3          F/U  Return in about 1 year (around 03/03/2024) for Annual Physical.  Elonda Husky, M.D. 03/04/2023 9:42 AM

## 2023-08-28 ENCOUNTER — Encounter: Payer: Self-pay | Admitting: Emergency Medicine

## 2023-08-28 ENCOUNTER — Ambulatory Visit
Admission: EM | Admit: 2023-08-28 | Discharge: 2023-08-28 | Disposition: A | Discharge status: Home / Self Care | Payer: Medicaid Other | Attending: Emergency Medicine | Admitting: Emergency Medicine

## 2023-08-28 DIAGNOSIS — J101 Influenza due to other identified influenza virus with other respiratory manifestations: Secondary | ICD-10-CM | POA: Insufficient documentation

## 2023-08-28 LAB — RESP PANEL BY RT-PCR (FLU A&B, COVID) ARPGX2
Influenza A by PCR: NEGATIVE
Influenza B by PCR: POSITIVE — AB
SARS Coronavirus 2 by RT PCR: NEGATIVE

## 2023-08-28 LAB — GROUP A STREP BY PCR: Group A Strep by PCR: NOT DETECTED

## 2023-08-28 MED ORDER — PROMETHAZINE-DM 6.25-15 MG/5ML PO SYRP: 5.0000 mL | ORAL_SOLUTION | Freq: Four times a day (QID) | ORAL | 0 refills | Status: AC | PRN

## 2023-08-28 MED ORDER — BENZONATATE 100 MG PO CAPS: 200.0000 mg | ORAL_CAPSULE | Freq: Three times a day (TID) | ORAL | 0 refills | Status: AC

## 2023-08-28 MED ORDER — OSELTAMIVIR PHOSPHATE 75 MG PO CAPS: 75.0000 mg | ORAL_CAPSULE | Freq: Two times a day (BID) | ORAL | 0 refills | Status: AC

## 2023-08-28 MED ORDER — IPRATROPIUM BROMIDE 0.06 % NA SOLN: 2.0000 | Freq: Four times a day (QID) | NASAL | 12 refills | Status: AC

## 2023-08-28 MED ORDER — ONDANSETRON 4 MG PO TBDP
4.0000 mg | ORAL_TABLET | Freq: Once | ORAL | Status: AC
  Administered 2023-08-28: 4 mg via ORAL

## 2023-08-28 MED ORDER — ONDANSETRON HCL 4 MG PO TABS: 4.0000 mg | ORAL_TABLET | Freq: Four times a day (QID) | ORAL | 0 refills | Status: AC

## 2023-08-28 NOTE — Discharge Instructions (Addendum)
 Take the Tamiflu twice daily for 5 days for treatment of influenza.  Use over-the-counter Tylenol and/or ibuprofen according the package instructions as needed for any fever or pain.  Use the Zofran, 4 mg every 6 hours, as needed for nausea or vomiting.  Use the Atrovent nasal spray, 2 squirts up each nostril every 6 hours, as needed for nasal congestion and runny nose.  Use over-the-counter Delsym, Zarbee's, or Robitussin during the day as needed for cough.  Use the Tessalon Perles every 8 hours as needed for cough.  Taken with a small sip of water.  You may experience some numbness to your tongue or metallic taste in your mouth, this is normal.  Use the Promethazine DM cough syrup at bedtime as will make you drowsy but it should help dry up your postnasal drip and aid you in sleep and cough relief.  Return for reevaluation, or see your primary care provider, for new or worsening symptoms.

## 2023-08-28 NOTE — ED Triage Notes (Signed)
 Pt c/o cough, vomiting, sore throat, post nasal drip, and body aches. Started last night.

## 2023-08-28 NOTE — ED Provider Notes (Signed)
 [facility]    CSN: [id number] Arrival date & time: 08/28/23  1043      History   Chief Complaint Chief Complaint  Patient presents with   Generalized Body Aches   Cough    HPI Alicia Hendricks is a 33 y.o. female.   HPI  33 year old female with past medical history significant for migraine headaches, hypertension, GERD, depression, and anxiety presents for evaluation of flulike symptoms that began last night.  These include chills, runny nose, nasal congestion, sore throat, headache, body aches, postnasal drip, cough that is productive for brown sputum, and vomiting.  She denies any fever, ear pain, shortness breath, wheezing, or diarrhea.  Multiple coworkers have been ill recently with influenza and GI illness.  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/26/2022   Essential hypertension 10/25/2020   Anxiety 04/13/2020   Delivery by cesarean section using transverse incision of lower segment of uterus 03/05/2017   Family history of cleft lip and palate    Fetal cleft lip affecting antepartum care of mother, fetus 1    History of abnormal cervical Pap smear 08/19/2016   Obesity (BMI 35.0-39.9 without comorbidity) 08/19/2016    Past Surgical History:  Procedure Laterality Date   CESAREAN SECTION N/A 03/05/2017   Procedure: PRIMARY CESAREAN SECTION;  Surgeon: Linzie Collin, MD;  Location: [facility] ORS;  Service: Obstetrics;  Laterality: N/A;   CHOLECYSTECTOMY      OB History     Gravida  2   Para  2   Term  2   Preterm      AB      Living  2      SAB      IAB      Ectopic      Multiple      Live Births  2            Home Medications    Prior to Admission medications   Medication Sig Start Date  End Date Taking? Authorizing Provider  benzonatate (TESSALON) 100 MG capsule Take 2 capsules (200 mg total) by mouth every 8 (eight) hours. 08/28/23  Yes Becky Augusta, NP  desogestrel-ethinyl estradiol (AZURETTE) 0.15-0.02/0.01 MG (21/5) tablet Take 1 tablet by mouth at bedtime. 03/04/23  Yes Linzie Collin, MD  escitalopram (LEXAPRO) 10 MG tablet Take 10 mg by mouth daily. 07/08/23 01/18/24 Yes [provider]  ipratropium (ATROVENT) 0.06 % nasal spray Place 2 sprays into both nostrils 4 (four) times daily. 08/28/23  Yes Becky Augusta, NP  losartan (COZAAR) 25 MG tablet Take 1 tablet by mouth daily. 07/08/23 07/07/24 Yes [provider]  Multiple Vitamin (MULTIVITAMIN) capsule Take 1 capsule by mouth daily.   Yes [provider]  ondansetron (ZOFRAN) 4 MG tablet Take 1 tablet (4 mg total) by mouth every 6 (six) hours. 08/28/23  Yes Becky Augusta, NP  oseltamivir (TAMIFLU) 75 MG capsule Take 1 capsule (75 mg total) by mouth every 12 (twelve) hours. 08/28/23  Yes Becky Augusta, NP  promethazine-dextromethorphan (PROMETHAZINE-DM) 6.25-15 MG/5ML syrup Take 5 mLs by mouth 4 (four) times daily as needed. 08/28/23  Yes Becky Augusta, NP  metoprolol succinate (TOPROL-XL) 25 MG 24 hr  tablet Take by mouth. Patient not taking: No sig reported 03/08/20 10/14/20  [provider]    Family History Family History  Problem Relation Age of Onset   Hyperlipidemia Mother    Asthma Brother    Diabetes Maternal Grandmother    Heart failure Maternal Grandmother    Stroke Maternal Grandmother    Thyroid disease Other    Breast cancer Neg Hx    Ovarian cancer Neg Hx    Colon cancer Neg Hx     Social History Social History   Tobacco Use   Smoking status: Former    Current packs/day: 0.00    Types: Cigarettes    Quit date: 07/06/2016    Years since quitting: 7.1   Smokeless tobacco: Never  Vaping Use   Vaping status: Every Day   Last attempt to quit: 10/26/2021  Substance Use Topics    Alcohol use: Yes    Comment: occ   Drug use: No     Allergies   Bee venom   Review of Systems Review of Systems  Constitutional:  Positive for chills. Negative for fever.  HENT:  Positive for congestion, postnasal drip, rhinorrhea and sore throat. Negative for ear pain.   Respiratory:  Positive for cough. Negative for shortness of breath and wheezing.   Gastrointestinal:  Positive for nausea and vomiting. Negative for diarrhea.  Musculoskeletal:  Positive for arthralgias and myalgias.  Skin:  Negative for rash.  Neurological:  Positive for headaches.     Physical Exam Triage Vital Signs ED Triage Vitals  Encounter Vitals Group     BP      Systolic BP Percentile      Diastolic BP Percentile      Pulse      Resp      Temp      Temp src      SpO2      Weight      Height      Head Circumference      Peak Flow      Pain Score      Pain Loc      Pain Education      Exclude from Growth Chart    No data found.  Updated Vital Signs BP (!) 141/97 (BP Location: Right Arm)   Pulse (!) 101   Temp 98.6 F (37 C) (Oral)   Resp 18   Ht 5\' 3"  (1.6 m)   Wt 213 lb 13.5 oz (97 kg)   LMP 07/23/2023 (Approximate)   SpO2 97%   BMI 37.88 kg/m   Visual Acuity Right Eye Distance:   Left Eye Distance:   Bilateral Distance:    Right Eye Near:   Left Eye Near:    Bilateral Near:     Physical Exam Vitals and nursing note reviewed.  Constitutional:      Appearance: Normal appearance. She is ill-appearing.  HENT:     Head: Normocephalic and atraumatic.     Right Ear: Tympanic membrane, ear canal and external ear normal. There is no impacted cerumen.     Left Ear: Tympanic membrane, ear canal and external ear normal. There is no impacted cerumen.     Nose: Congestion and rhinorrhea present.     Comments: Nasal mucosa is edematous and erythematous with clear discharge in both nares.    Mouth/Throat:     Mouth: Mucous membranes are moist.     Pharynx: Oropharynx is  clear. Posterior oropharyngeal erythema present. No oropharyngeal  exudate.     Comments: Tonsillar pillars are edematous and erythematous but free of exudate. Neck:     Comments: Bilateral tender, anterior cervical lymphadenopathy present. Cardiovascular:     Rate and Rhythm: Normal rate and regular rhythm.     Pulses: Normal pulses.     Heart sounds: Normal heart sounds. No murmur heard.    No friction rub. No gallop.  Pulmonary:     Effort: Pulmonary effort is normal.     Breath sounds: Normal breath sounds. No wheezing, rhonchi or rales.  Abdominal:     Palpations: Abdomen is soft.     Tenderness: There is no abdominal tenderness. There is no guarding or rebound.  Musculoskeletal:     Cervical back: Normal range of motion and neck supple. Tenderness present.  Lymphadenopathy:     Cervical: Cervical adenopathy present.  Skin:    General: Skin is warm and dry.     Capillary Refill: Capillary refill takes less than 2 seconds.     Findings: No erythema or rash.  Neurological:     General: No focal deficit present.     Mental Status: She is alert and oriented to person, place, and time.      UC Treatments / Results  Labs (all labs ordered are listed, but only abnormal results are displayed) Labs Reviewed  RESP PANEL BY RT-PCR (FLU A&B, COVID) ARPGX2 - Abnormal; Notable for the following components:      Result Value   Influenza B by PCR POSITIVE (*)    All other components within normal limits  GROUP A STREP BY PCR    EKG   Radiology No results found.  Procedures Procedures (including critical care time)  Medications Ordered in UC Medications  ondansetron (ZOFRAN-ODT) disintegrating tablet 4 mg (4 mg Oral Given 08/28/23 1310)    Initial Impression / Assessment and Plan / UC Course  I have reviewed the triage vital signs and the nursing notes.  Pertinent labs & imaging results that were available during my care of the patient were reviewed by me and considered in  my medical decision making (see chart for details).   Patient is a pleasant, though mildly ill-appearing 33 year old female presenting for evaluation of flulike symptoms as outlined HPI above.  She does endorse feeling mildly nauseous in the exam room so I will order 4 mg of ODT Zofran to help her with the nausea.  I will also order a p.o. fluid challenge 20 minutes after Zofran ministration.  Her physical exam does reveal inflammation of her upper respiratory tract.  Her cardiopulmonary dam is benign.  Abdomen is soft and nontender.  Differential diagnosis include COVID, influenza, viral respiratory infection, strep pharyngitis, GI illness.  I will order a COVID and flu PCR and strep PCR.  Respiratory panel was positive for influenza B.  Strep PCR is negative.  I will discharge patient home with a diagnosis of influenza B with a prescription for Tamiflu 75 mg twice daily for 5 days.  Also Zofran 4 mg every 6 hours as needed for nausea or vomiting.  Atrovent Nasabid for nasal congestion.  Tessalon Perles and Promethazine DM cough syrup for cough and congestion.   Final Clinical Impressions(s) / UC Diagnoses   Final diagnoses:  Influenza B     Discharge Instructions      Take the Tamiflu twice daily for 5 days for treatment of influenza.  Use over-the-counter Tylenol and/or ibuprofen according the package instructions as needed for any fever or  pain.  Use the Zofran, 4 mg every 6 hours, as needed for nausea or vomiting.  Use the Atrovent nasal spray, 2 squirts up each nostril every 6 hours, as needed for nasal congestion and runny nose.  Use over-the-counter Delsym, Zarbee's, or Robitussin during the day as needed for cough.  Use the Tessalon Perles every 8 hours as needed for cough.  Taken with a small sip of water.  You may experience some numbness to your tongue or metallic taste in your mouth, this is normal.  Use the Promethazine DM cough syrup at bedtime as will make you drowsy  but it should help dry up your postnasal drip and aid you in sleep and cough relief.  Return for reevaluation, or see your primary care provider, for new or worsening symptoms.      ED Prescriptions     Medication Sig Dispense Auth. Provider   benzonatate (TESSALON) 100 MG capsule Take 2 capsules (200 mg total) by mouth every 8 (eight) hours. 21 capsule Becky Augusta, NP   ipratropium (ATROVENT) 0.06 % nasal spray Place 2 sprays into both nostrils 4 (four) times daily. 15 mL Becky Augusta, NP   promethazine-dextromethorphan (PROMETHAZINE-DM) 6.25-15 MG/5ML syrup Take 5 mLs by mouth 4 (four) times daily as needed. 118 mL Becky Augusta, NP   ondansetron (ZOFRAN) 4 MG tablet Take 1 tablet (4 mg total) by mouth every 6 (six) hours. 12 tablet Becky Augusta, NP   oseltamivir (TAMIFLU) 75 MG capsule Take 1 capsule (75 mg total) by mouth every 12 (twelve) hours. 10 capsule Becky Augusta, NP      PDMP not reviewed this encounter.   Becky Augusta, NP 08/28/23 1332

## 2024-04-20 ENCOUNTER — Other Ambulatory Visit: Payer: Self-pay | Admitting: Obstetrics and Gynecology

## 2024-04-20 DIAGNOSIS — Z3041 Encounter for surveillance of contraceptive pills: Secondary | ICD-10-CM

## 2024-04-21 NOTE — Telephone Encounter (Signed)
 SABRA
